# Patient Record
Sex: Male | Born: 1965
Health system: Southern US, Community
[De-identification: ages and names within clinical notes are randomized; demographics above are authoritative.]

## PROBLEM LIST (undated history)

## (undated) DIAGNOSIS — I10 Essential (primary) hypertension: Secondary | ICD-10-CM

## (undated) HISTORY — PX: WRIST SURGERY: SHX841

## (undated) HISTORY — PX: COLONOSCOPY W/ BIOPSIES: SHX1374

---

## 2010-09-17 ENCOUNTER — Emergency Department: Payer: Self-pay | Admitting: Emergency Medicine

## 2014-07-31 ENCOUNTER — Emergency Department: Payer: Self-pay | Admitting: Student

## 2015-01-26 ENCOUNTER — Encounter: Payer: Self-pay | Admitting: Primary Care

## 2015-01-26 ENCOUNTER — Ambulatory Visit (INDEPENDENT_AMBULATORY_CARE_PROVIDER_SITE_OTHER): Payer: BLUE CROSS/BLUE SHIELD | Admitting: Primary Care

## 2015-01-26 VITALS — BP 124/80 | HR 86 | Temp 98.5°F | Ht 71.0 in | Wt 246.8 lb

## 2015-01-26 DIAGNOSIS — G479 Sleep disorder, unspecified: Secondary | ICD-10-CM | POA: Diagnosis not present

## 2015-01-26 DIAGNOSIS — E669 Obesity, unspecified: Secondary | ICD-10-CM | POA: Diagnosis not present

## 2015-01-26 DIAGNOSIS — Z720 Tobacco use: Secondary | ICD-10-CM | POA: Diagnosis not present

## 2015-01-26 NOTE — Progress Notes (Signed)
Pre visit review using our clinic review tool, if applicable. No additional management support is needed unless otherwise documented below in the visit note. 

## 2015-01-26 NOTE — Assessment & Plan Note (Signed)
Suspect this may be caused by either the sweet tea he drinks at night or sleep apnea. Counseling provided to discontinue his sweet tea at night after 6pm. Referral made for sleep study given his body habitus and complaints mention in the HPI.

## 2015-01-26 NOTE — Progress Notes (Signed)
Subjective:    Patient ID: Austin Hill, male    DOB: [birth year], 49 y.o.   MRN: [id number]  HPI  Austin Hill is a 49 year old male who presents today to establish care and discuss the problems mentioned below. He's not been to a PCP in over 10 years; therefore, is not aware of any recent records.  1) Depression and anxiety: Diagnosed 4 years ago while going through a divorce. He participated in therapy and was placed on medication for about one year therapy and was on medication. 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 Denies panic attacks, excessive anxiety or worry over the past 6 months, difficulty concentrating, irritability   2) Sleep distrubance: He reports difficulty staying asleep throughout the night for the past several years.  On average he will get about 6 hours. He doesn't have difficulty initially falling asleep, but will find himself waking at night, experiencing daytime tiredness, snoring, and finding it difficult to fall back asleep. He will take benadryl OTC with occasional help. He does drink sweet tea before bed and will fall asleep watching TV.  He's not had a sleep study in the past.  3) Obesity: His diet is overall unhealthy as he does not watch his food intake. His diet consists of fast food, fried foods, eating out at restaurants. Lately he's tried improving his diet with grilled chicken, bread, grilled vegetables, and fruit. He mainly drinks sweet tea, coffee with milk and sugar, water, one mountain dew per day. He's active for work, but does not exercise daily. He plans on exercising at the neighborhood gym once he's moved into his new house.  Body mass index is 34.43 kg/(m^2).   4) Tobacco abuse: He's smoked cigarettes for the past 25 years, 1 PPD. He did quit once for 6 months about 15 years ago. He denies hemoptysis, shortness of  breath, coughs in the morning. He was once trialed on Chantix which made him feel "unsteady and awful".   Review of Systems  Constitutional: Negative for fatigue and unexpected weight change.  HENT: Negative for rhinorrhea.   Respiratory: Negative for cough and shortness of breath.   Cardiovascular: Negative for chest pain and leg swelling.  Gastrointestinal: Negative for diarrhea, constipation and blood in stool.  Genitourinary: Negative for dysuria and frequency.  Musculoskeletal: Negative for myalgias and arthralgias.  Skin: Negative for rash.  Neurological: Negative for dizziness and headaches.  Hematological: Negative for adenopathy.  Psychiatric/Behavioral:       Denies concerns for depression, occasional anxiety       No past medical history on file.  History   Social History  . Marital Status: Divorced    Spouse Name: N/A  . Number of Children: N/A  . Years of Education: N/A   Occupational History  . Not on file.   Social History Main Topics  . Smoking status: Current Every Day Smoker -- 1.00 packs/day for 25 years    Types: Cigarettes  . Smokeless tobacco: Never Used  . Alcohol Use: 0.0 oz/week    0 Standard drinks or equivalent per week     Comment: every couple of days  . Drug Use: No  . Sexual Activity: Not on file   Other Topics Concern  . Not on file   Social History Narrative   From [place] area.   Single, has girlfriend.  Three children. His son lives locally.   Enjoy camping, golfing, swimming.    Past Surgical History  Procedure Laterality Date  . Wrist surgery Left 103-66 years old    compound fracture  . Colonoscopy w/ biopsies  about 10 years ago    polyps    Family History  Problem Relation Age of Onset  . Aneurysm Father     Abdominal  . Aneurysm Paternal Grandfather     Abdominal     No Known Allergies  No current outpatient prescriptions on file prior to visit.   No current facility-administered medications on file prior  to visit.    BP 124/80 mmHg  Pulse 86  Temp(Src) 98.5 F (36.9 C) (Oral)  Ht 5\' 11"  (1.803 m)  Wt 246 lb 12 oz (111.925 kg)  BMI 34.43 kg/m2  SpO2 97%    Objective:   Physical Exam  Constitutional: He is oriented to person, place, and time. He appears well-developed.  HENT:  Right Ear: Tympanic membrane and ear canal normal.  Left Ear: Tympanic membrane and ear canal normal.  Nose: Nose normal.  Mouth/Throat: Oropharynx is clear and moist.  Eyes: Conjunctivae and EOM are normal. Pupils are equal, round, and reactive to light.  Neck: Neck supple. No thyromegaly present.  Cardiovascular: Normal rate and regular rhythm.   Pulmonary/Chest: Effort normal and breath sounds normal.  Abdominal: Soft. Bowel sounds are normal.  Lymphadenopathy:    He has no cervical adenopathy.  Neurological: He is alert and oriented to person, place, and time. He has normal reflexes. No cranial nerve deficit.  Skin: Skin is warm and dry.  Psychiatric: He has a normal mood and affect.          Assessment & Plan:

## 2015-01-26 NOTE — Assessment & Plan Note (Signed)
Overall unhealthy diet.  Spent time discussing healthy food options and the importance of exercising. Will complete physical in the next 1-2 months. Will continue to monitor.

## 2015-01-26 NOTE — Patient Instructions (Signed)
You will be contacted regarding your referral to pulmonology for your sleep study. You need to be exercising at least 30-45 minutes daily for 5 days a week. Limit carbohydrates in the form of pastas, rice, potatoes, grits, fried foods, sugary foods and drinks. Incorporate fresh fruits and vegetables, lean meat, whole grain, water. It's crucial to quit smoking. You may try the nicotine patch in combination with the gum as discussed.  Schedule a physical and lab appointment with me in the next 2 months.  It was a pleasure to meet you today! Please don't hesitate to call me with any questions. Welcome to [facility]!  Cardiac Diet This diet can help prevent heart disease and stroke. Many factors influence your heart health, including eating and exercise habits. Coronary risk rises a lot with abnormal blood fat (lipid) levels. Cardiac meal planning includes limiting unhealthy fats, increasing healthy fats, and making other small dietary changes. General guidelines are as follows:  Adjust calorie intake to reach and maintain desirable body weight.  Limit total fat intake to less than 30% of total calories. Saturated fat should be less than 7% of calories.  Saturated fats are found in animal products and in some vegetable products. Saturated vegetable fats are found in coconut oil, cocoa butter, palm oil, and palm kernel oil. Read labels carefully to avoid these products as much as possible. Use butter in moderation. Choose tub margarines and oils that have 2 grams of fat or less. Good cooking oils are canola and olive oils.  Practice low-fat cooking techniques. Do not fry food. Instead, broil, bake, boil, steam, grill, roast on a rack, stir-fry, or microwave it. Other fat reducing suggestions include:  Remove the skin from poultry.  Remove all visible fat from meats.  Skim the fat off stews, soups, and gravies before serving them.  Steam vegetables in water or broth instead of sauting them in  fat.  Avoid foods with trans fat (or hydrogenated oils), such as commercially fried foods and commercially baked goods. Commercial shortening and deep-frying fats will contain trans fat.  Increase intake of fruits, vegetables, whole grains, and legumes to replace foods high in fat.  Increase consumption of nuts, legumes, and seeds to at least 4 servings weekly. One serving of a legume equals  cup, and 1 serving of nuts or seeds equals  cup.  Choose whole grains more often. Have 3 servings per day (a serving is 1 ounce [oz]).  Eat 4 to 5 servings of vegetables per day. A serving of vegetables is 1 cup of raw leafy vegetables;  cup of raw or cooked cut-up vegetables;  cup of vegetable juice.  Eat 4 to 5 servings of fruit per day. A serving of fruit is 1 medium whole fruit;  cup of dried fruit;  cup of fresh, frozen, or canned fruit;  cup of 100% fruit juice.  Increase your intake of dietary fiber to 20 to 30 grams per day. Insoluble fiber may help lower your risk of heart disease and may help curb your appetite.  Soluble fiber binds cholesterol to be removed from the blood. Foods high in soluble fiber are dried beans, citrus fruits, oats, apples, bananas, broccoli, Brussels sprouts, and eggplant.  Try to include foods fortified with plant sterols or stanols, such as yogurt, breads, juices, or margarines. Choose several fortified foods to achieve a daily intake of 2 to 3 grams of plant sterols or stanols.  Foods with omega-3 fats can help reduce your risk of heart disease. Aim  to have a 3.5 oz portion of fatty fish twice per week, such as salmon, mackerel, albacore tuna, sardines, lake trout, or herring. If you wish to take a fish oil supplement, choose one that contains 1 gram of both DHA and EPA.  Limit processed meats to 2 servings (3 oz portion) weekly.  Limit the sodium in your diet to 1500 milligrams (mg) per day. If you have high blood pressure, talk to a registered dietitian about  a DASH (Dietary Approaches to Stop Hypertension) eating plan.  Limit sweets and beverages with added sugar, such as soda, to no more than 5 servings per week. One serving is:   1 tablespoon sugar.  1 tablespoon jelly or jam.   cup sorbet.  1 cup lemonade.   cup regular soda. CHOOSING FOODS Starches  Allowed: Breads: All kinds (wheat, rye, raisin, white, oatmeal, [place], [place], and English muffin bread). Low-fat rolls: English muffins, frankfurter and hamburger buns, bagels, pita bread, tortillas (not fried). Pancakes, waffles, biscuits, and muffins made with recommended oil.  Avoid: Products made with saturated or trans fats, oils, or whole milk products. Butter rolls, cheese breads, croissants. Commercial doughnuts, muffins, sweet rolls, biscuits, waffles, pancakes, store-bought mixes. Crackers  Allowed: Low-fat crackers and snacks: Animal, graham, rye, saltine (with recommended oil, no lard), oyster, and matzo crackers. Bread sticks, melba toast, rusks, flatbread, pretzels, and light popcorn.  Avoid: High-fat crackers: cheese crackers, butter crackers, and those made with coconut, palm oil, or trans fat (hydrogenated oils). Buttered popcorn. Cereals  Allowed: Hot or cold whole-grain cereals.  Avoid: Cereals containing coconut, hydrogenated vegetable fat, or animal fat. Potatoes / Pasta / Rice  Allowed: All kinds of potatoes, rice, and pasta (such as macaroni, spaghetti, and noodles).  Avoid: Pasta or rice prepared with cream sauce or high-fat cheese. Chow mein noodles, [place] fries. Vegetables  Allowed: All vegetables and vegetable juices.  Avoid: Fried vegetables. Vegetables in cream, butter, or high-fat cheese sauces. Limit coconut. Fruit in cream or custard. Protein  Allowed: Limit your intake of meat, seafood, and poultry to no more than 6 oz (cooked weight) per day. All lean, well-trimmed beef, veal, pork, and lamb. All chicken and [place] without skin. All fish  and shellfish. Wild game: wild duck, rabbit, pheasant, and venison. Egg whites or low-cholesterol egg substitutes may be used as desired. Meatless dishes: recipes with dried beans, peas, lentils, and tofu (soybean curd). Seeds and nuts: all seeds and most nuts.  Avoid: Prime grade and other heavily marbled and fatty meats, such as short ribs, spare ribs, rib eye roast or steak, frankfurters, sausage, bacon, and high-fat luncheon meats, mutton. Caviar. Commercially fried fish. Domestic duck, goose, venison sausage. Organ meats: liver, gizzard, heart, chitterlings, brains, kidney, sweetbreads. Dairy  Allowed: Low-fat cheeses: nonfat or low-fat cottage cheese (1% or 2% fat), cheeses made with part skim milk, such as mozzarella, farmers, string, or ricotta. (Cheeses should be labeled no more than 2 to 6 grams fat per oz.). Skim (or 1%) milk: liquid, powdered, or evaporated. Buttermilk made with low-fat milk. Drinks made with skim or low-fat milk or cocoa. Chocolate milk or cocoa made with skim or low-fat (1%) milk. Nonfat or low-fat yogurt.  Avoid: Whole milk cheeses, including colby, cheddar, [place], Monterey Jack, [employer], [place], [place], American, Swiss, and blue. Creamed cottage cheese, cream cheese. Whole milk and whole milk products, including buttermilk or yogurt made from whole milk, drinks made from whole milk. Condensed milk, evaporated whole milk, and 2% milk. Soups and Combination Foods  Allowed: Low-fat low-sodium soups: broth, dehydrated soups, homemade broth, soups with the fat removed, homemade cream soups made with skim or low-fat milk. Low-fat spaghetti, lasagna, chili, and Spanish rice if low-fat ingredients and low-fat cooking techniques are used.  Avoid: Cream soups made with whole milk, cream, or high-fat cheese. All other soups. Desserts and Sweets  Allowed: Sherbet, fruit ices, gelatins, meringues, and angel food cake. Homemade desserts with recommended fats, oils, and milk  products. Jam, jelly, honey, marmalade, sugars, and syrups. Pure sugar candy, such as gum drops, hard candy, jelly beans, marshmallows, mints, and small amounts of dark chocolate.  Avoid: Commercially prepared cakes, pies, cookies, frosting, pudding, or mixes for these products. Desserts containing whole milk products, chocolate, coconut, lard, palm oil, or palm kernel oil. Ice cream or ice cream drinks. Candy that contains chocolate, coconut, butter, hydrogenated fat, or unknown ingredients. Buttered syrups. Fats and Oils  Allowed: Vegetable oils: safflower, sunflower, corn, soybean, cottonseed, sesame, canola, olive, or peanut. Non-hydrogenated margarines. Salad dressing or mayonnaise: homemade or commercial, made with a recommended oil. Low or nonfat salad dressing or mayonnaise.  Limit added fats and oils to 6 to 8 tsp per day (includes fats used in cooking, baking, salads, and spreads on bread). Remember to count the "hidden fats" in foods.  Avoid: Solid fats and shortenings: butter, lard, salt pork, bacon drippings. Gravy containing meat fat, shortening, or suet. Cocoa butter, coconut. Coconut oil, palm oil, palm kernel oil, or hydrogenated oils: these ingredients are often used in bakery products, nondairy creamers, whipped toppings, candy, and commercially fried foods. Read labels carefully. Salad dressings made of unknown oils, sour cream, or cheese, such as blue cheese and Roquefort. Cream, all kinds: half-and-half, light, heavy, or whipping. Sour cream or cream cheese (even if "light" or low-fat). Nondairy cream substitutes: coffee creamers and sour cream substitutes made with palm, palm kernel, hydrogenated oils, or coconut oil. Beverages  Allowed: Coffee (regular or decaffeinated), tea. Diet carbonated beverages, mineral water. Alcohol: Check with your caregiver. Moderation is recommended.  Avoid: Whole milk, regular sodas, and juice drinks with added sugar. Condiments  Allowed: All  seasonings and condiments. Cocoa powder. "Cream" sauces made with recommended ingredients.  Avoid: Carob powder made with hydrogenated fats. SAMPLE MENU Breakfast   cup orange juice   cup oatmeal  1 slice toast  1 tsp margarine  1 cup skim milk Lunch  [place] sandwich with 2 oz [place], 2 slices bread  Lettuce and tomato slices  Fresh fruit  Carrot sticks  Coffee or tea Snack  Fresh fruit or low-fat crackers Dinner  3 oz lean ground beef  1 baked potato  1 tsp margarine   cup asparagus  Lettuce salad  1 tbs non-creamy dressing   cup peach slices  1 cup skim milk Document Released: 06/28/2008 Document Revised: 03/20/2012 Document Reviewed: 11/19/2013 Exit[employer] Patient Information 2015 [place], [place]. This information is not intended to replace advice given to you by your health care provider. Make sure you discuss any questions you have with your health care provider.

## 2015-01-26 NOTE — Assessment & Plan Note (Signed)
Spent >5 minutes discussing the importance of quitting. He plans on weaning himself down off of cigarettes. Information provided on the nicotine patch and gum. He's traialed Chantix in the past which made him feel awful. Will continue to monitor

## 2015-02-01 ENCOUNTER — Other Ambulatory Visit: Payer: Self-pay | Admitting: Primary Care

## 2015-02-01 DIAGNOSIS — Z0000 Encounter for general adult medical examination without abnormal findings: Secondary | ICD-10-CM

## 2015-02-03 ENCOUNTER — Other Ambulatory Visit (INDEPENDENT_AMBULATORY_CARE_PROVIDER_SITE_OTHER): Payer: BLUE CROSS/BLUE SHIELD

## 2015-02-03 DIAGNOSIS — Z125 Encounter for screening for malignant neoplasm of prostate: Secondary | ICD-10-CM | POA: Diagnosis not present

## 2015-02-03 DIAGNOSIS — Z0000 Encounter for general adult medical examination without abnormal findings: Secondary | ICD-10-CM | POA: Diagnosis not present

## 2015-02-03 LAB — CBC WITH DIFFERENTIAL/PLATELET
BASOS PCT: 0.6 % (ref 0.0–3.0)
Basophils Absolute: 0 10*3/uL (ref 0.0–0.1)
EOS ABS: 0.1 10*3/uL (ref 0.0–0.7)
Eosinophils Relative: 1.9 % (ref 0.0–5.0)
HCT: 46.7 % (ref 39.0–52.0)
HEMOGLOBIN: 16.3 g/dL (ref 13.0–17.0)
LYMPHS ABS: 1.4 10*3/uL (ref 0.7–4.0)
Lymphocytes Relative: 23.3 % (ref 12.0–46.0)
MCHC: 34.8 g/dL (ref 30.0–36.0)
MCV: 94.1 fl (ref 78.0–100.0)
MONO ABS: 0.4 10*3/uL (ref 0.1–1.0)
Monocytes Relative: 7.2 % (ref 3.0–12.0)
NEUTROS ABS: 4.1 10*3/uL (ref 1.4–7.7)
Neutrophils Relative %: 67 % (ref 43.0–77.0)
Platelets: 233 10*3/uL (ref 150.0–400.0)
RBC: 4.97 Mil/uL (ref 4.22–5.81)
RDW: 13.4 % (ref 11.5–15.5)
WBC: 6.2 10*3/uL (ref 4.0–10.5)

## 2015-02-03 LAB — COMPREHENSIVE METABOLIC PANEL
ALBUMIN: 4.2 g/dL (ref 3.5–5.2)
ALT: 19 U/L (ref 0–53)
AST: 15 U/L (ref 0–37)
Alkaline Phosphatase: 51 U/L (ref 39–117)
BUN: 11 mg/dL (ref 6–23)
CALCIUM: 9.7 mg/dL (ref 8.4–10.5)
CHLORIDE: 104 meq/L (ref 96–112)
CO2: 25 mEq/L (ref 19–32)
CREATININE: 1.04 mg/dL (ref 0.40–1.50)
GFR: 80.28 mL/min (ref >60.00)
Glucose, Bld: 101 mg/dL — ABNORMAL HIGH (ref 70–99)
POTASSIUM: 4.2 meq/L (ref 3.5–5.1)
SODIUM: 136 meq/L (ref 135–145)
TOTAL PROTEIN: 6.8 g/dL (ref 6.0–8.3)
Total Bilirubin: 0.6 mg/dL (ref 0.2–1.2)

## 2015-02-03 LAB — LIPID PANEL
CHOL/HDL RATIO: 4
Cholesterol: 190 mg/dL (ref 0–200)
HDL: 48.2 mg/dL (ref >39.00)
LDL Cholesterol: 121 mg/dL — ABNORMAL HIGH (ref 0–99)
NONHDL: 141.8
Triglycerides: 103 mg/dL (ref 0.0–149.0)
VLDL: 20.6 mg/dL (ref 0.0–40.0)

## 2015-02-03 LAB — PSA: PSA: 0.77 ng/mL (ref 0.10–4.00)

## 2015-02-03 LAB — TSH: TSH: 1.74 u[IU]/mL (ref 0.35–4.50)

## 2015-02-03 LAB — HEMOGLOBIN A1C: Hgb A1c MFr Bld: 6 % (ref 4.6–6.5)

## 2015-02-04 ENCOUNTER — Other Ambulatory Visit: Payer: BLUE CROSS/BLUE SHIELD

## 2015-02-06 ENCOUNTER — Other Ambulatory Visit: Payer: BLUE CROSS/BLUE SHIELD

## 2015-02-10 ENCOUNTER — Ambulatory Visit (INDEPENDENT_AMBULATORY_CARE_PROVIDER_SITE_OTHER): Payer: BLUE CROSS/BLUE SHIELD | Admitting: Primary Care

## 2015-02-10 ENCOUNTER — Encounter: Payer: Self-pay | Admitting: Primary Care

## 2015-02-10 VITALS — BP 126/84 | HR 88 | Temp 98.8°F | Ht 71.0 in | Wt 248.0 lb

## 2015-02-10 DIAGNOSIS — Z0000 Encounter for general adult medical examination without abnormal findings: Secondary | ICD-10-CM

## 2015-02-10 DIAGNOSIS — Z0001 Encounter for general adult medical examination with abnormal findings: Secondary | ICD-10-CM | POA: Insufficient documentation

## 2015-02-10 DIAGNOSIS — Z23 Encounter for immunization: Secondary | ICD-10-CM | POA: Diagnosis not present

## 2015-02-10 DIAGNOSIS — Z1211 Encounter for screening for malignant neoplasm of colon: Secondary | ICD-10-CM

## 2015-02-10 DIAGNOSIS — Z720 Tobacco use: Secondary | ICD-10-CM

## 2015-02-10 DIAGNOSIS — Z136 Encounter for screening for cardiovascular disorders: Secondary | ICD-10-CM

## 2015-02-10 DIAGNOSIS — E669 Obesity, unspecified: Secondary | ICD-10-CM

## 2015-02-10 DIAGNOSIS — G479 Sleep disorder, unspecified: Secondary | ICD-10-CM

## 2015-02-10 NOTE — Progress Notes (Signed)
Subjective:    Patient ID: Austin Hill, male    DOB: 02-27-1966, 49 y.o.   MRN: [id number]  HPI  Austin Hill is a 49 year old male who presents today for complete physical.  Immunizations: -Tetanus: Last Tdap in 1977, last Td was in 1999. Due for Tdap today. -Influenza: Did not get last season -Pneumonia: Current smoker, has never had, declines today.  Diet: He's been trying to incorporate more fruit; grilled lean meats. Drinking more water, has been limiting his sweet tea, and has now decreased to 1 soda per day.  Exercise: He's active at work and on the weekend; plans on joining the gym soon after moving. Eye exam: Last exam was one year ago. Follows [facility] ([facility] and plans on scheduling. Dental exam: Last exam was one year ago. Follows with [facility], plans on scheduling. Colonoscopy: Due Sleep Study: Referral made last visit; he postponed the appointment for now.  1) FH of abdominal aneurysm: Last ultrasound was 10 years ago. 2) Tobacco abuse: He's trying to cut back and is smoking 1 PPD. He's looking into the patch and gum, but is not ready to quit.  3) Stress: reports increased stress at work with some anxiety. Denies symptoms of worry, irritability, restlessness, difficulty sleeping. He's able to manage his stress by removing himself from the situation. He would like further evaluation of his stress during the next visit.  Review of Systems  Constitutional: Negative for unexpected weight change.       Daytime tiredness  HENT: Negative for rhinorrhea.   Eyes: Positive for photophobia.  Respiratory: Negative for cough and shortness of breath.   Cardiovascular: Negative for chest pain.  Gastrointestinal: Negative for diarrhea, constipation and blood in stool.  Genitourinary: Negative for dysuria, frequency and difficulty urinating.  Musculoskeletal: Negative for myalgias and arthralgias.  Skin: Negative for rash.  Allergic/Immunologic: Negative for  environmental allergies.  Neurological: Negative for dizziness, numbness and headaches.  Hematological: Negative for adenopathy.       No past medical history on file.  History   Social History  . Marital Status: Divorced    Spouse Name: N/A  . Number of Children: N/A  . Years of Education: N/A   Occupational History  . Not on file.   Social History Main Topics  . Smoking status: Current Every Day Smoker -- 1.00 packs/day for 25 years    Types: Cigarettes  . Smokeless tobacco: Never Used  . Alcohol Use: 0.0 oz/week    0 Standard drinks or equivalent per week     Comment: every couple of days  . Drug Use: No  . Sexual Activity: Not on file   Other Topics Concern  . Not on file   Social History Narrative   From [place] area.   Single, has girlfriend.   Three children. His son lives locally.   Enjoy camping, golfing, swimming.    Past Surgical History  Procedure Laterality Date  . Wrist surgery Left 32-26 years old    compound fracture  . Colonoscopy w/ biopsies  about 10 years ago    polyps    Family History  Problem Relation Age of Onset  . Aneurysm Father     Abdominal  . Aneurysm Paternal Grandfather     Abdominal     No Known Allergies  Current Outpatient Prescriptions on File Prior to Visit  Medication Sig Dispense Refill  . ibuprofen (ADVIL,MOTRIN) 200 MG tablet Take 200 mg by mouth as needed.    Marland Kitchen  Multiple Vitamin (MULTIVITAMIN) tablet Take 1 tablet by mouth daily.     No current facility-administered medications on file prior to visit.    BP 126/84 mmHg  Pulse 88  Temp(Src) 98.8 F (37.1 C) (Oral)  Ht 5\' 11"  (1.803 m)  Wt 248 lb (112.492 kg)  BMI 34.60 kg/m2  SpO2 96%    Objective:   Physical Exam  Constitutional: He is oriented to person, place, and time. He appears well-developed.  HENT:  Right Ear: Tympanic membrane and ear canal normal.  Left Ear: Tympanic membrane and ear canal normal.  Nose: Nose normal.  Mouth/Throat:  Oropharynx is clear and moist.  Eyes: Conjunctivae and EOM are normal. Pupils are equal, round, and reactive to light.  Neck: Neck supple. No thyromegaly present.  Cardiovascular: Normal rate and regular rhythm.   Pulmonary/Chest: Effort normal and breath sounds normal.  Abdominal: Soft. Bowel sounds are normal.  Musculoskeletal: Normal range of motion.  Lymphadenopathy:    He has no cervical adenopathy.  Neurological: He is alert and oriented to person, place, and time. He has normal reflexes. No cranial nerve deficit. Coordination normal.  Skin: Skin is warm and dry.  Psychiatric: He has a normal mood and affect.          Assessment & Plan:  Stress:  Will discuss at future appointment. Will consider SSRI and/or therapy if stress is still problematic. He plans on trying to work through daily stress on his own at this time.

## 2015-02-10 NOTE — Progress Notes (Signed)
Pre visit review using our clinic review tool, if applicable. No additional management support is needed unless otherwise documented below in the visit note. 

## 2015-02-10 NOTE — Assessment & Plan Note (Signed)
Discussed importance of healthy diet and exercise. Tdap administered today as he was due. Declines Pneumovax. Referral made for colonoscopy and abdominal ultrasound due to [place] of aortic aneurysm.  Due for eye and dental exams for which he will schedule soon.

## 2015-02-10 NOTE — Assessment & Plan Note (Signed)
Working towards improving his diet with healthier food choices. He will be moving soon and plans on exercising in his new complex.

## 2015-02-10 NOTE — Assessment & Plan Note (Signed)
Smoking 1 PPD, is not ready to quit. Is considering trying the patch and gum.

## 2015-02-10 NOTE — Assessment & Plan Note (Signed)
Referral made for sleep study, he cancelled his appointment due to "other issues" he would like to address first. Explained the long term effects of sleep apnea and the importance of a sleep study. He will re-schedule at a later date.

## 2015-02-10 NOTE — Patient Instructions (Addendum)
You will be contacted regarding your Colonoscopy and Abdominal Ultrasound. Let me know if you do not hear back within one week. Your labs are overall good.  It is important that you improve your diet. Please limit carbohydrates in the form of white bread, rice, pasta, cakes, cookies, sugary drinks, etc. Increase your consumption of fresh fruits and vegetables. Be sure to drink plenty of water daily. Call me when you are ready to stop smoking. Follow up in 1 month for re-evaluation of stress/anxiety. It was nice seeing you today!  Cardiac Diet This diet can help prevent heart disease and stroke. Many factors influence your heart health, including eating and exercise habits. Coronary risk rises a lot with abnormal blood fat (lipid) levels. Cardiac meal planning includes limiting unhealthy fats, increasing healthy fats, and making other small dietary changes. General guidelines are as follows:  Adjust calorie intake to reach and maintain desirable body weight.  Limit total fat intake to less than 30% of total calories. Saturated fat should be less than 7% of calories.  Saturated fats are found in animal products and in some vegetable products. Saturated vegetable fats are found in coconut oil, cocoa butter, palm oil, and palm kernel oil. Read labels carefully to avoid these products as much as possible. Use butter in moderation. Choose tub margarines and oils that have 2 grams of fat or less. Good cooking oils are canola and olive oils.  Practice low-fat cooking techniques. Do not fry food. Instead, broil, bake, boil, steam, grill, roast on a rack, stir-fry, or microwave it. Other fat reducing suggestions include:  Remove the skin from poultry.  Remove all visible fat from meats.  Skim the fat off stews, soups, and gravies before serving them.  Steam vegetables in water or broth instead of sauting them in fat.  Avoid foods with trans fat (or hydrogenated oils), such as commercially fried  foods and commercially baked goods. Commercial shortening and deep-frying fats will contain trans fat.  Increase intake of fruits, vegetables, whole grains, and legumes to replace foods high in fat.  Increase consumption of nuts, legumes, and seeds to at least 4 servings weekly. One serving of a legume equals  cup, and 1 serving of nuts or seeds equals  cup.  Choose whole grains more often. Have 3 servings per day (a serving is 1 ounce [oz]).  Eat 4 to 5 servings of vegetables per day. A serving of vegetables is 1 cup of raw leafy vegetables;  cup of raw or cooked cut-up vegetables;  cup of vegetable juice.  Eat 4 to 5 servings of fruit per day. A serving of fruit is 1 medium whole fruit;  cup of dried fruit;  cup of fresh, frozen, or canned fruit;  cup of 100% fruit juice.  Increase your intake of dietary fiber to 20 to 30 grams per day. Insoluble fiber may help lower your risk of heart disease and may help curb your appetite.  Soluble fiber binds cholesterol to be removed from the blood. Foods high in soluble fiber are dried beans, citrus fruits, oats, apples, bananas, broccoli, Brussels sprouts, and eggplant.  Try to include foods fortified with plant sterols or stanols, such as yogurt, breads, juices, or margarines. Choose several fortified foods to achieve a daily intake of 2 to 3 grams of plant sterols or stanols.  Foods with omega-3 fats can help reduce your risk of heart disease. Aim to have a 3.5 oz portion of fatty fish twice per week, such as salmon,  mackerel, albacore tuna, sardines, lake trout, or herring. If you wish to take a fish oil supplement, choose one that contains 1 gram of both DHA and EPA.  Limit processed meats to 2 servings (3 oz portion) weekly.  Limit the sodium in your diet to 1500 milligrams (mg) per day. If you have high blood pressure, talk to a registered dietitian about a DASH (Dietary Approaches to Stop Hypertension) eating plan.  Limit sweets and  beverages with added sugar, such as soda, to no more than 5 servings per week. One serving is:   1 tablespoon sugar.  1 tablespoon jelly or jam.   cup sorbet.  1 cup lemonade.   cup regular soda. CHOOSING FOODS Starches  Allowed: Breads: All kinds (wheat, rye, raisin, white, oatmeal, [place], [place], and English muffin bread). Low-fat rolls: English muffins, frankfurter and hamburger buns, bagels, pita bread, tortillas (not fried). Pancakes, waffles, biscuits, and muffins made with recommended oil.  Avoid: Products made with saturated or trans fats, oils, or whole milk products. Butter rolls, cheese breads, croissants. Commercial doughnuts, muffins, sweet rolls, biscuits, waffles, pancakes, store-bought mixes. Crackers  Allowed: Low-fat crackers and snacks: Animal, graham, rye, saltine (with recommended oil, no lard), oyster, and matzo crackers. Bread sticks, melba toast, rusks, flatbread, pretzels, and light popcorn.  Avoid: High-fat crackers: cheese crackers, butter crackers, and those made with coconut, palm oil, or trans fat (hydrogenated oils). Buttered popcorn. Cereals  Allowed: Hot or cold whole-grain cereals.  Avoid: Cereals containing coconut, hydrogenated vegetable fat, or animal fat. Potatoes / Pasta / Rice  Allowed: All kinds of potatoes, rice, and pasta (such as macaroni, spaghetti, and noodles).  Avoid: Pasta or rice prepared with cream sauce or high-fat cheese. Chow mein noodles, [place] fries. Vegetables  Allowed: All vegetables and vegetable juices.  Avoid: Fried vegetables. Vegetables in cream, butter, or high-fat cheese sauces. Limit coconut. Fruit in cream or custard. Protein  Allowed: Limit your intake of meat, seafood, and poultry to no more than 6 oz (cooked weight) per day. All lean, well-trimmed beef, veal, pork, and lamb. All chicken and [place] without skin. All fish and shellfish. Wild game: wild duck, rabbit, pheasant, and venison. Egg whites or  low-cholesterol egg substitutes may be used as desired. Meatless dishes: recipes with dried beans, peas, lentils, and tofu (soybean curd). Seeds and nuts: all seeds and most nuts.  Avoid: Prime grade and other heavily marbled and fatty meats, such as short ribs, spare ribs, rib eye roast or steak, frankfurters, sausage, bacon, and high-fat luncheon meats, mutton. Caviar. Commercially fried fish. Domestic duck, goose, venison sausage. Organ meats: liver, gizzard, heart, chitterlings, brains, kidney, sweetbreads. Dairy  Allowed: Low-fat cheeses: nonfat or low-fat cottage cheese (1% or 2% fat), cheeses made with part skim milk, such as mozzarella, farmers, string, or ricotta. (Cheeses should be labeled no more than 2 to 6 grams fat per oz.). Skim (or 1%) milk: liquid, powdered, or evaporated. Buttermilk made with low-fat milk. Drinks made with skim or low-fat milk or cocoa. Chocolate milk or cocoa made with skim or low-fat (1%) milk. Nonfat or low-fat yogurt.  Avoid: Whole milk cheeses, including colby, cheddar, [place], Monterey Jack, [place], [place], [place], American, Swiss, and blue. Creamed cottage cheese, cream cheese. Whole milk and whole milk products, including buttermilk or yogurt made from whole milk, drinks made from whole milk. Condensed milk, evaporated whole milk, and 2% milk. Soups and Combination Foods  Allowed: Low-fat low-sodium soups: broth, dehydrated soups, homemade broth, soups with the fat removed,  homemade cream soups made with skim or low-fat milk. Low-fat spaghetti, lasagna, chili, and Spanish rice if low-fat ingredients and low-fat cooking techniques are used.  Avoid: Cream soups made with whole milk, cream, or high-fat cheese. All other soups. Desserts and Sweets  Allowed: Sherbet, fruit ices, gelatins, meringues, and angel food cake. Homemade desserts with recommended fats, oils, and milk products. Jam, jelly, honey, marmalade, sugars, and syrups. Pure sugar candy, such as  gum drops, hard candy, jelly beans, marshmallows, mints, and small amounts of dark chocolate.  Avoid: Commercially prepared cakes, pies, cookies, frosting, pudding, or mixes for these products. Desserts containing whole milk products, chocolate, coconut, lard, palm oil, or palm kernel oil. Ice cream or ice cream drinks. Candy that contains chocolate, coconut, butter, hydrogenated fat, or unknown ingredients. Buttered syrups. Fats and Oils  Allowed: Vegetable oils: safflower, sunflower, corn, soybean, cottonseed, sesame, canola, olive, or peanut. Non-hydrogenated margarines. Salad dressing or mayonnaise: homemade or commercial, made with a recommended oil. Low or nonfat salad dressing or mayonnaise.  Limit added fats and oils to 6 to 8 tsp per day (includes fats used in cooking, baking, salads, and spreads on bread). Remember to count the "hidden fats" in foods.  Avoid: Solid fats and shortenings: butter, lard, salt pork, bacon drippings. Gravy containing meat fat, shortening, or suet. Cocoa butter, coconut. Coconut oil, palm oil, palm kernel oil, or hydrogenated oils: these ingredients are often used in bakery products, nondairy creamers, whipped toppings, candy, and commercially fried foods. Read labels carefully. Salad dressings made of unknown oils, sour cream, or cheese, such as blue cheese and Roquefort. Cream, all kinds: half-and-half, light, heavy, or whipping. Sour cream or cream cheese (even if "light" or low-fat). Nondairy cream substitutes: coffee creamers and sour cream substitutes made with palm, palm kernel, hydrogenated oils, or coconut oil. Beverages  Allowed: Coffee (regular or decaffeinated), tea. Diet carbonated beverages, mineral water. Alcohol: Check with your caregiver. Moderation is recommended.  Avoid: Whole milk, regular sodas, and juice drinks with added sugar. Condiments  Allowed: All seasonings and condiments. Cocoa powder. "Cream" sauces made with recommended  ingredients.  Avoid: Carob powder made with hydrogenated fats. SAMPLE MENU Breakfast   cup orange juice   cup oatmeal  1 slice toast  1 tsp margarine  1 cup skim milk Lunch  [place] sandwich with 2 oz [place], 2 slices bread  Lettuce and tomato slices  Fresh fruit  Carrot sticks  Coffee or tea Snack  Fresh fruit or low-fat crackers Dinner  3 oz lean ground beef  1 baked potato  1 tsp margarine   cup asparagus  Lettuce salad  1 tbs non-creamy dressing   cup peach slices  1 cup skim milk Document Released: 06/28/2008 Document Revised: 03/20/2012 Document Reviewed: 11/19/2013 ExitCare Patient Information 2015 [place], [place]. This information is not intended to replace advice given to you by your health care provider. Make sure you discuss any questions you have with your health care provider.

## 2015-03-16 ENCOUNTER — Telehealth: Payer: Self-pay | Admitting: Primary Care

## 2015-03-16 ENCOUNTER — Ambulatory Visit: Payer: BLUE CROSS/BLUE SHIELD | Admitting: Primary Care

## 2015-03-16 NOTE — Telephone Encounter (Signed)
Appointment has been canceled.

## 2015-03-16 NOTE — Telephone Encounter (Signed)
Called patient regarding missed appointment. He reports his anxiety has decreased and is managing well. He is to call if his symptoms worsen. Vallarie Mare, please do not charge him a "no show" fee. Thanks.

## 2015-03-30 ENCOUNTER — Telehealth: Payer: Self-pay | Admitting: Primary Care

## 2015-03-30 NOTE — Telephone Encounter (Signed)
Noted  

## 2015-03-30 NOTE — Telephone Encounter (Signed)
Spoke to pt. He does not want the AAA procedure at this time. Please cancel order.   Also, pt did not want pulmonary and gi referral either. I have cancelled those referrals as well.  Thanks

## 2015-09-24 ENCOUNTER — Ambulatory Visit: Payer: BLUE CROSS/BLUE SHIELD | Admitting: Primary Care

## 2016-01-12 ENCOUNTER — Telehealth: Payer: Self-pay | Admitting: Primary Care

## 2016-01-12 NOTE — Telephone Encounter (Signed)
I spoke with pt; pt has not felt himself for couple days; pt said has a stressful job; pt continues with the H/A and on and off SOB; no CP or dizziness. Pt is on no meds and has not had hx of hypertension; offered pt appt at another [facility] but was too far for pt to go. Pt will go to [facility] now.

## 2016-01-12 NOTE — Telephone Encounter (Signed)
Patient Name: CHRIS Krahenbuhl  DOB: 18-Sep-1966    Initial Comment Caller states his BP is high 167/108.    Nurse Assessment  Nurse: Thad Ranger RN, Denise Date/Time (Eastern Time): 01/12/2016 1:52:49 PM  Confirm and document reason for call. If symptomatic, describe symptoms. You must click the next button to save text entered. ---Caller states his BP is high 167/108 taken at [employer] while working. Has had a HA and not felt "normal" over the last 2 days. No hx of HTN.  Has the patient traveled out of the country within the last 30 days? ---Not Applicable  Does the patient have any new or worsening symptoms? ---Yes  Will a triage be completed? ---Yes  Related visit to physician within the last 2 weeks? ---No  Does the PT have any chronic conditions? (i.e. diabetes, asthma, etc.) ---No  Is this a behavioral health or substance abuse call? ---No     Guidelines    Guideline Title Affirmed Question Affirmed Notes  High Blood Pressure [1] BP ? 160 / 100 AND [2] cardiac or neurologic symptoms (e.g., chest pain, difficulty breathing, unsteady gait, blurred vision)    Final Disposition User   Go to ED Now [place], RN, Langley Gauss    Comments  Pt reports slight SOB over the last 2 days as well. Wants RN to call to see if he can be seen at [facility] today. Does not want to go to ER.  Viewed schedule for multiple MD's in the practice and do not see any available appts today. Advised pt no available appts and will need to proceed to ER as advised. He states he may go to [facility] or just call the [facility] in the am to make an appt.  RN did not call [facility] for auth to downgrade to be seen in [facility] instead of ER due to there are no appts avail.   Referrals  GO TO FACILITY REFUSED  GO TO FACILITY REFUSED   Disagree/Comply: Disagree  Disagree/Comply Reason: Disagree with instructions

## 2016-01-12 NOTE — Telephone Encounter (Signed)
Noted. Would like to see for follow up in 1-2 weeks. Please schedule.

## 2016-01-13 NOTE — Telephone Encounter (Signed)
Called patient and schedule follow up on 01/19/2016

## 2016-01-19 ENCOUNTER — Ambulatory Visit (INDEPENDENT_AMBULATORY_CARE_PROVIDER_SITE_OTHER): Payer: BLUE CROSS/BLUE SHIELD | Admitting: Primary Care

## 2016-01-19 ENCOUNTER — Encounter: Payer: Self-pay | Admitting: Primary Care

## 2016-01-19 VITALS — BP 144/92 | HR 81 | Temp 98.5°F | Ht 71.0 in | Wt 249.4 lb

## 2016-01-19 DIAGNOSIS — IMO0001 Reserved for inherently not codable concepts without codable children: Secondary | ICD-10-CM

## 2016-01-19 DIAGNOSIS — R030 Elevated blood-pressure reading, without diagnosis of hypertension: Secondary | ICD-10-CM | POA: Diagnosis not present

## 2016-01-19 DIAGNOSIS — I10 Essential (primary) hypertension: Secondary | ICD-10-CM | POA: Insufficient documentation

## 2016-01-19 NOTE — Patient Instructions (Signed)
Check your blood pressure daily, around the same time of day, for the next 2 weeks.   Ensure that you have rested for 30 minutes prior to checking your blood pressure. Record your readings as I will call you in 2 weeks.  Take a look at the information below regarding dietary approaches to reduce blood pressure.  It was a pleasure to see you today!  DASH Eating Plan DASH stands for "Dietary Approaches to Stop Hypertension." The DASH eating plan is a healthy eating plan that has been shown to reduce high blood pressure (hypertension). Additional health benefits may include reducing the risk of type 2 diabetes mellitus, heart disease, and stroke. The DASH eating plan may also help with weight loss. WHAT DO I NEED TO KNOW ABOUT THE DASH EATING PLAN? For the DASH eating plan, you will follow these general guidelines:  Choose foods with a percent daily value for sodium of less than 5% (as listed on the food label).  Use salt-free seasonings or herbs instead of table salt or sea salt.  Check with your health care provider or pharmacist before using salt substitutes.  Eat lower-sodium products, often labeled as "lower sodium" or "no salt added."  Eat fresh foods.  Eat more vegetables, fruits, and low-fat dairy products.  Choose whole grains. Look for the word "whole" as the first word in the ingredient list.  Choose fish and skinless chicken or [place] more often than red meat. Limit fish, poultry, and meat to 6 oz (170 g) each day.  Limit sweets, desserts, sugars, and sugary drinks.  Choose heart-healthy fats.  Limit cheese to 1 oz (28 g) per day.  Eat more home-cooked food and less restaurant, buffet, and fast food.  Limit fried foods.  Cook foods using methods other than frying.  Limit canned vegetables. If you do use them, rinse them well to decrease the sodium.  When eating at a restaurant, ask that your food be prepared with less salt, or no salt if possible. WHAT FOODS CAN I  EAT? Seek help from a dietitian for individual calorie needs. Grains Whole grain or whole wheat bread. Brown rice. Whole grain or whole wheat pasta. Quinoa, bulgur, and whole grain cereals. Low-sodium cereals. Corn or whole wheat flour tortillas. Whole grain cornbread. Whole grain crackers. Low-sodium crackers. Vegetables Fresh or frozen vegetables (raw, steamed, roasted, or grilled). Low-sodium or reduced-sodium tomato and vegetable juices. Low-sodium or reduced-sodium tomato sauce and paste. Low-sodium or reduced-sodium canned vegetables.  Fruits All fresh, canned (in natural juice), or frozen fruits. Meat and Other Protein Products Ground beef (85% or leaner), grass-fed beef, or beef trimmed of fat. Skinless chicken or [place]. Ground chicken or [place]. Pork trimmed of fat. All fish and seafood. Eggs. Dried beans, peas, or lentils. Unsalted nuts and seeds. Unsalted canned beans. Dairy Low-fat dairy products, such as skim or 1% milk, 2% or reduced-fat cheeses, low-fat ricotta or cottage cheese, or plain low-fat yogurt. Low-sodium or reduced-sodium cheeses. Fats and Oils Tub margarines without trans fats. Light or reduced-fat mayonnaise and salad dressings (reduced sodium). Avocado. Safflower, olive, or canola oils. Natural peanut or almond butter. Other Unsalted popcorn and pretzels. The items listed above may not be a complete list of recommended foods or beverages. Contact your dietitian for more options. WHAT FOODS ARE NOT RECOMMENDED? Grains White bread. White pasta. White rice. Refined cornbread. Bagels and croissants. Crackers that contain trans fat. Vegetables Creamed or fried vegetables. Vegetables in a cheese sauce. Regular canned vegetables. Regular canned tomato  sauce and paste. Regular tomato and vegetable juices. Fruits Dried fruits. Canned fruit in light or heavy syrup. Fruit juice. Meat and Other Protein Products Fatty cuts of meat. Ribs, chicken wings, bacon, sausage,  bologna, salami, chitterlings, fatback, hot dogs, bratwurst, and packaged luncheon meats. Salted nuts and seeds. Canned beans with salt. Dairy Whole or 2% milk, cream, half-and-half, and cream cheese. Whole-fat or sweetened yogurt. Full-fat cheeses or blue cheese. Nondairy creamers and whipped toppings. Processed cheese, cheese spreads, or cheese curds. Condiments Onion and garlic salt, seasoned salt, table salt, and sea salt. Canned and packaged gravies. Worcestershire sauce. Tartar sauce. Barbecue sauce. Teriyaki sauce. Soy sauce, including reduced sodium. Steak sauce. Fish sauce. Oyster sauce. Cocktail sauce. Horseradish. Ketchup and mustard. Meat flavorings and tenderizers. Bouillon cubes. Hot sauce. Tabasco sauce. Marinades. Taco seasonings. Relishes. Fats and Oils Butter, stick margarine, lard, shortening, ghee, and bacon fat. Coconut, palm kernel, or palm oils. Regular salad dressings. Other Pickles and olives. Salted popcorn and pretzels. The items listed above may not be a complete list of foods and beverages to avoid. Contact your dietitian for more information. WHERE CAN I FIND MORE INFORMATION? National Heart, Lung, and Blood Institute: travelstabloid.com   This information is not intended to replace advice given to you by your health care provider. Make sure you discuss any questions you have with your health care provider.   Document Released: 09/08/2011 Document Revised: 10/10/2014 Document Reviewed: 07/24/2013 Elsevier Interactive Patient Education [employer].

## 2016-01-19 NOTE — Progress Notes (Signed)
Subjective:    Patient ID: Austin Hill, male    DOB: [birth year], 50 y.o.   MRN: [id number]  HPI  Mr. Ochsner is a 50 year old male who presents today with a chief complaint of headache and shortness of breath. He called into team health on 04/11 with same complaints and a BP of 167/108 at [employer] while working that day.   He has no prior history of hypertension, although his BP is above goal in the clinic today at 144/92. He's checked his BP at work over the past several days with readings of 140/80, 130/90, 111/78. He's recently been experiencing increased stress at work and also with personal life. He's not had a headache since 04/11. He went home that day, took an aspirin, napped, and felt better. He denies chest pain, dizziness, visual changes, headaches.  He endorses a fair diet. Breakfast: Coffee, eggs, sausage, pancakes on the weekends. Lunch: [place] sandwich, chips, crackers Dinner: Fast food, chicken wings, pizza, pork chops, steaks, some vegetables Snacks: Occasional Honey Bun Desserts: 3 times weekly Beverages: Coffee, water, 1 soda daily, occasional sweet tea, 1-3 drinks two-three times weekly.   Exercise: He is walking 30 minutes several days weekly for the past 1 week.  Review of Systems  Eyes: Negative for visual disturbance.  Respiratory: Negative for shortness of breath.   Cardiovascular: Negative for chest pain.  Neurological: Negative for dizziness, numbness and headaches.       No past medical history on file.   Social History   Social History  . Marital Status: Divorced    Spouse Name: N/A  . Number of Children: N/A  . Years of Education: N/A   Occupational History  . Not on file.   Social History Main Topics  . Smoking status: Current Every Day Smoker -- 1.00 packs/day for 25 years    Types: Cigarettes  . Smokeless tobacco: Never Used  . Alcohol Use: 0.0 oz/week    0 Standard drinks or equivalent per week     Comment: every  couple of days  . Drug Use: No  . Sexual Activity: Not on file   Other Topics Concern  . Not on file   Social History Narrative   From [place] area.   Single, has girlfriend.   Three children. His son lives locally.   Enjoy camping, golfing, swimming.    Past Surgical History  Procedure Laterality Date  . Wrist surgery Left 31-10 years old    compound fracture  . Colonoscopy w/ biopsies  about 10 years ago    polyps    Family History  Problem Relation Age of Onset  . Aneurysm Father     Abdominal  . Aneurysm Paternal Grandfather     Abdominal     No Known Allergies  Current Outpatient Prescriptions on File Prior to Visit  Medication Sig Dispense Refill  . ibuprofen (ADVIL,MOTRIN) 200 MG tablet Take 200 mg by mouth as needed.    . Multiple Vitamin (MULTIVITAMIN) tablet Take 1 tablet by mouth daily.     No current facility-administered medications on file prior to visit.    BP 144/92 mmHg  Pulse 81  Temp(Src) 98.5 F (36.9 C) (Oral)  Ht 5\' 11"  (1.803 m)  Wt 249 lb 6.4 oz (113.127 kg)  BMI 34.80 kg/m2  SpO2 97%    Objective:   Physical Exam  Constitutional: He appears well-nourished.  Cardiovascular: Normal rate and regular rhythm.   Pulmonary/Chest: Effort normal and breath  sounds normal.  Skin: Skin is warm and dry.  Psychiatric: He has a normal mood and affect.  Endorsed a lot of personal stress at home recently          [place]:

## 2016-01-19 NOTE — Progress Notes (Signed)
Pre visit review using our clinic review tool, if applicable. No additional management support is needed unless otherwise documented below in the visit note. 

## 2016-01-19 NOTE — Assessment & Plan Note (Signed)
Recent stress at work and home life, suspect BP readings are related. BP slowly decreasing since team health call on 04/11. BP slightly above goal today. Will have him check home BP for 2 weeks. If they remain elevated, then will consider initiating medication. Discussed DASH diet as his diet consists of moderate salt content. Asymptomatic since 04/11.

## 2016-02-02 ENCOUNTER — Telehealth: Payer: Self-pay | Admitting: Primary Care

## 2016-02-02 NOTE — Telephone Encounter (Signed)
-----   Message from Pleas Koch, NP sent at 01/19/2016  9:53 AM EDT ----- Regarding: BP Will you please check on Mr. Bouknight's BP readings?

## 2016-02-02 NOTE — Telephone Encounter (Signed)
Noted  

## 2016-02-02 NOTE — Telephone Encounter (Signed)
Message left for patient to return my call.  

## 2016-02-02 NOTE — Telephone Encounter (Signed)
Patient called back. He stated that he is feeling better. BP runs around 130-135 over 85-90.

## 2017-06-19 ENCOUNTER — Ambulatory Visit (INDEPENDENT_AMBULATORY_CARE_PROVIDER_SITE_OTHER): Payer: 59 | Admitting: Primary Care

## 2017-06-19 ENCOUNTER — Encounter: Payer: Self-pay | Admitting: Primary Care

## 2017-06-19 VITALS — BP 140/86 | HR 98 | Temp 98.2°F | Ht 71.0 in | Wt 250.8 lb

## 2017-06-19 DIAGNOSIS — R030 Elevated blood-pressure reading, without diagnosis of hypertension: Secondary | ICD-10-CM | POA: Diagnosis not present

## 2017-06-19 DIAGNOSIS — E669 Obesity, unspecified: Secondary | ICD-10-CM | POA: Diagnosis not present

## 2017-06-19 DIAGNOSIS — Z0000 Encounter for general adult medical examination without abnormal findings: Secondary | ICD-10-CM

## 2017-06-19 DIAGNOSIS — Z1211 Encounter for screening for malignant neoplasm of colon: Secondary | ICD-10-CM

## 2017-06-19 DIAGNOSIS — E1165 Type 2 diabetes mellitus with hyperglycemia: Secondary | ICD-10-CM | POA: Insufficient documentation

## 2017-06-19 DIAGNOSIS — R7303 Prediabetes: Secondary | ICD-10-CM | POA: Diagnosis not present

## 2017-06-19 LAB — COMPREHENSIVE METABOLIC PANEL
ALT: 31 U/L (ref 0–53)
AST: 22 U/L (ref 0–37)
Albumin: 4.6 g/dL (ref 3.5–5.2)
Alkaline Phosphatase: 59 U/L (ref 39–117)
BUN: 11 mg/dL (ref 6–23)
CHLORIDE: 105 meq/L (ref 96–112)
CO2: 23 meq/L (ref 19–32)
Calcium: 9.8 mg/dL (ref 8.4–10.5)
Creatinine, Ser: 0.99 mg/dL (ref 0.40–1.50)
GFR: 84.19 mL/min (ref >60.00)
GLUCOSE: 110 mg/dL — AB (ref 70–99)
Potassium: 4.3 mEq/L (ref 3.5–5.1)
SODIUM: 137 meq/L (ref 135–145)
Total Bilirubin: 0.4 mg/dL (ref 0.2–1.2)
Total Protein: 7.2 g/dL (ref 6.0–8.3)

## 2017-06-19 LAB — LIPID PANEL
Cholesterol: 172 mg/dL (ref 0–200)
HDL: 58.2 mg/dL (ref >39.00)
LDL CALC: 94 mg/dL (ref 0–99)
NonHDL: 114.03
Total CHOL/HDL Ratio: 3
Triglycerides: 98 mg/dL (ref 0.0–149.0)
VLDL: 19.6 mg/dL (ref 0.0–40.0)

## 2017-06-19 LAB — HEMOGLOBIN A1C: HEMOGLOBIN A1C: 6 % (ref 4.6–6.5)

## 2017-06-19 NOTE — Assessment & Plan Note (Signed)
Noted on labs from 2016, repeat A1C due and pending. Discussed the importance of a healthy diet and regular exercise in order for weight loss, and to reduce the risk of other medical problems.

## 2017-06-19 NOTE — Assessment & Plan Note (Signed)
Td UTD, declines influenza vaccination. PSA UTD. Colonoscopy due, order placed. Discussed the importance of a healthy diet and regular exercise in order for weight loss, and to reduce the risk of other medical problems. Exam unremarkable. Labs pending. Follow up in 1 year.

## 2017-06-19 NOTE — Progress Notes (Signed)
Subjective:    Patient ID: Austin Hill, male    DOB: 1966/03/17, 51 y.o.   MRN: [id number]  HPI  Austin Hill is a 51 year old male who presents today for complete physical.  Immunizations: -Tetanus: Completed in 2016 -Influenza: Declines   Diet: He endorses a fair diet. Breakfast: Ham biscuit, fast food, coffee Lunch: Fast food Dinner: Meat, vegetables, starch Snacks: None Desserts: Occasionally  Beverages: Water, one soda daily, sweet tea, coffee  Exercise: He does not currently exercise Eye exam: Due Dental exam: Completes semi-annually  Colonoscopy:  PSA: UTD   Review of Systems  Constitutional: Negative for unexpected weight change.  HENT: Negative for rhinorrhea.   Respiratory: Negative for cough and shortness of breath.   Cardiovascular: Negative for chest pain.  Gastrointestinal: Negative for constipation and diarrhea.  Genitourinary: Negative for difficulty urinating.  Musculoskeletal: Negative for arthralgias and myalgias.  Skin: Negative for rash.  Allergic/Immunologic: Negative for environmental allergies.  Neurological: Negative for dizziness, numbness and headaches.  Psychiatric/Behavioral:       Increased stress at work and home.       No past medical history on file.   Social History   Social History  . Marital status: Divorced    Spouse name: N/A  . Number of children: N/A  . Years of education: N/A   Occupational History  . Not on file.   Social History Main Topics  . Smoking status: Current Every Day Smoker    Packs/day: 1.00    Years: 25.00    Types: Cigarettes  . Smokeless tobacco: Never Used  . Alcohol use 0.0 oz/week     Comment: every couple of days  . Drug use: No  . Sexual activity: Not on file   Other Topics Concern  . Not on file   Social History Narrative   From [place] area.   Single, has girlfriend.   Three children. Austin Hill lives locally.   Enjoy camping, golfing, swimming.    Past Surgical History:   Procedure Laterality Date  . COLONOSCOPY W/ BIOPSIES  about 10 years ago   polyps  . WRIST SURGERY Left 71-60 years old   compound fracture    Family History  Problem Relation Age of Onset  . Aneurysm Father        Abdominal  . Aneurysm Paternal Grandfather        Abdominal     No Known Allergies  Current Outpatient Prescriptions on File Prior to Visit  Medication Sig Dispense Refill  . ibuprofen (ADVIL,MOTRIN) 200 MG tablet Take 200 mg by mouth as needed.    . Multiple Vitamin (MULTIVITAMIN) tablet Take 1 tablet by mouth daily.     No current facility-administered medications on file prior to visit.     BP 140/86   Pulse 98   Temp 98.2 F (36.8 C) (Oral)   Ht 5\' 11"  (1.803 m)   Wt 250 lb 12.8 oz (113.8 kg)   SpO2 98%   BMI 34.98 kg/m    Objective:   Physical Exam  Constitutional: He is oriented to person, place, and time. He appears well-nourished.  HENT:  Right Ear: Tympanic membrane and ear canal normal.  Left Ear: Tympanic membrane and ear canal normal.  Nose: Nose normal. Right sinus exhibits no maxillary sinus tenderness and no frontal sinus tenderness. Left sinus exhibits no maxillary sinus tenderness and no frontal sinus tenderness.  Mouth/Throat: Oropharynx is clear and moist.  Eyes: Pupils are equal, round, and  reactive to light. Conjunctivae and EOM are normal.  Neck: Neck supple. Carotid bruit is not present. No thyromegaly present.  Cardiovascular: Normal rate, regular rhythm and normal heart sounds.   Pulmonary/Chest: Effort normal and breath sounds normal. He has no wheezes. He has no rales.  Abdominal: Soft. Bowel sounds are normal. There is no tenderness.  Musculoskeletal: Normal range of motion.  Neurological: He is alert and oriented to person, place, and time. He has normal reflexes. No cranial nerve deficit.  Skin: Skin is warm and dry.  Psychiatric: He has a normal mood and affect.          Assessment & Plan:

## 2017-06-19 NOTE — Assessment & Plan Note (Signed)
Discussed the importance of a healthy diet and regular exercise in order for weight loss, and to reduce the risk of other medical problems.  

## 2017-06-19 NOTE — Patient Instructions (Signed)
Complete lab work prior to leaving today. I will notify you of your results once received.   Start exercising. You should be getting 150 minutes of moderate intensity exercise weekly.  It's important to improve your diet by reducing consumption of fast food, fried food, processed snack foods, sugary drinks. Increase consumption of fresh vegetables and fruits, whole grains, water.  Ensure you are drinking 64 ounces of water daily.  Continue to monitor your blood pressure and notify me if you get numbers above 135/90 on a consistent basis. Improvement in diet and regular exercise will improve these numbers.  Follow up in 1 year for your annual exam or sooner if needed.  It was a pleasure to see you today!   DASH Eating Plan DASH stands for "Dietary Approaches to Stop Hypertension." The DASH eating plan is a healthy eating plan that has been shown to reduce high blood pressure (hypertension). It may also reduce your risk for type 2 diabetes, heart disease, and stroke. The DASH eating plan may also help with weight loss. What are tips for following this plan? General guidelines  Avoid eating more than 2,300 mg (milligrams) of salt (sodium) a day. If you have hypertension, you may need to reduce your sodium intake to 1,500 mg a day.  Limit alcohol intake to no more than 1 drink a day for nonpregnant women and 2 drinks a day for men. One drink equals 12 oz of beer, 5 oz of wine, or 1 oz of hard liquor.  Work with your health care provider to maintain a healthy body weight or to lose weight. Ask what an ideal weight is for you.  Get at least 30 minutes of exercise that causes your heart to beat faster (aerobic exercise) most days of the week. Activities may include walking, swimming, or biking.  Work with your health care provider or diet and nutrition specialist (dietitian) to adjust your eating plan to your individual calorie needs. Reading food labels  Check food labels for the amount of  sodium per serving. Choose foods with less than 5 percent of the Daily Value of sodium. Generally, foods with less than 300 mg of sodium per serving fit into this eating plan.  To find whole grains, look for the word "whole" as the first word in the ingredient list. Shopping  Buy products labeled as "low-sodium" or "no salt added."  Buy fresh foods. Avoid canned foods and premade or frozen meals. Cooking  Avoid adding salt when cooking. Use salt-free seasonings or herbs instead of table salt or sea salt. Check with your health care provider or pharmacist before using salt substitutes.  Do not fry foods. Cook foods using healthy methods such as baking, boiling, grilling, and broiling instead.  Cook with heart-healthy oils, such as olive, canola, soybean, or sunflower oil. Meal planning   Eat a balanced diet that includes: ? 5 or more servings of fruits and vegetables each day. At each meal, try to fill half of your plate with fruits and vegetables. ? Up to 6-8 servings of whole grains each day. ? Less than 6 oz of lean meat, poultry, or fish each day. A 3-oz serving of meat is about the same size as a deck of cards. One egg equals 1 oz. ? 2 servings of low-fat dairy each day. ? A serving of nuts, seeds, or beans 5 times each week. ? Heart-healthy fats. Healthy fats called Omega-3 fatty acids are found in foods such as flaxseeds and coldwater fish, like  sardines, salmon, and mackerel.  Limit how much you eat of the following: ? Canned or prepackaged foods. ? Food that is high in trans fat, such as fried foods. ? Food that is high in saturated fat, such as fatty meat. ? Sweets, desserts, sugary drinks, and other foods with added sugar. ? Full-fat dairy products.  Do not salt foods before eating.  Try to eat at least 2 vegetarian meals each week.  Eat more home-cooked food and less restaurant, buffet, and fast food.  When eating at a restaurant, ask that your food be prepared with  less salt or no salt, if possible. What foods are recommended? The items listed may not be a complete list. Talk with your dietitian about what dietary choices are best for you. Grains Whole-grain or whole-wheat bread. Whole-grain or whole-wheat pasta. Brown rice. [place]. Bulgur. Whole-grain and low-sodium cereals. Pita bread. Low-fat, low-sodium crackers. Whole-wheat flour tortillas. Vegetables Fresh or frozen vegetables (raw, steamed, roasted, or grilled). Low-sodium or reduced-sodium tomato and vegetable juice. Low-sodium or reduced-sodium tomato sauce and tomato paste. Low-sodium or reduced-sodium canned vegetables. Fruits All fresh, dried, or frozen fruit. Canned fruit in natural juice (without added sugar). Meat and other protein foods Skinless chicken or [place]. Ground chicken or [place]. Pork with fat trimmed off. Fish and seafood. Egg whites. Dried beans, peas, or lentils. Unsalted nuts, nut butters, and seeds. Unsalted canned beans. Lean cuts of beef with fat trimmed off. Low-sodium, lean deli meat. Dairy Low-fat (1%) or fat-free (skim) milk. Fat-free, low-fat, or reduced-fat cheeses. Nonfat, low-sodium ricotta or cottage cheese. Low-fat or nonfat yogurt. Low-fat, low-sodium cheese. Fats and oils Soft margarine without trans fats. Vegetable oil. Low-fat, reduced-fat, or light mayonnaise and salad dressings (reduced-sodium). Canola, safflower, olive, soybean, and sunflower oils. Avocado. Seasoning and other foods Herbs. Spices. Seasoning mixes without salt. Unsalted popcorn and pretzels. Fat-free sweets. What foods are not recommended? The items listed may not be a complete list. Talk with your dietitian about what dietary choices are best for you. Grains Baked goods made with fat, such as croissants, muffins, or some breads. Dry pasta or rice meal packs. Vegetables Creamed or fried vegetables. Vegetables in a cheese sauce. Regular canned vegetables (not low-sodium or  reduced-sodium). Regular canned tomato sauce and paste (not low-sodium or reduced-sodium). Regular tomato and vegetable juice (not low-sodium or reduced-sodium). [name]. Olives. Fruits Canned fruit in a light or heavy syrup. Fried fruit. Fruit in cream or butter sauce. Meat and other protein foods Fatty cuts of meat. Ribs. Fried meat. Berniece Salines. Sausage. Bologna and other processed lunch meats. Salami. Fatback. Hotdogs. Bratwurst. Salted nuts and seeds. Canned beans with added salt. Canned or smoked fish. Whole eggs or egg yolks. Chicken or [place] with skin. Dairy Whole or 2% milk, cream, and half-and-half. Whole or full-fat cream cheese. Whole-fat or sweetened yogurt. Full-fat cheese. Nondairy creamers. Whipped toppings. Processed cheese and cheese spreads. Fats and oils Butter. Stick margarine. Lard. Shortening. Ghee. Bacon fat. Tropical oils, such as coconut, palm kernel, or palm oil. Seasoning and other foods Salted popcorn and pretzels. Onion salt, garlic salt, seasoned salt, table salt, and sea salt. Worcestershire sauce. Tartar sauce. Barbecue sauce. Teriyaki sauce. Soy sauce, including reduced-sodium. Steak sauce. Canned and packaged gravies. Fish sauce. Oyster sauce. Cocktail sauce. Horseradish that you find on the shelf. Ketchup. Mustard. Meat flavorings and tenderizers. Bouillon cubes. Hot sauce and Tabasco sauce. Premade or packaged marinades. Premade or packaged taco seasonings. Relishes. Regular salad dressings. Where to find more information:  National Heart, Lung, and Blood Institute: https://wilson-eaton.com/  American Heart Association: www.heart.org Summary  The DASH eating plan is a healthy eating plan that has been shown to reduce high blood pressure (hypertension). It may also reduce your risk for type 2 diabetes, heart disease, and stroke.  With the DASH eating plan, you should limit salt (sodium) intake to 2,300 mg a day. If you have hypertension, you may need to reduce your sodium  intake to 1,500 mg a day.  When on the DASH eating plan, aim to eat more fresh fruits and vegetables, whole grains, lean proteins, low-fat dairy, and heart-healthy fats.  Work with your health care provider or diet and nutrition specialist (dietitian) to adjust your eating plan to your individual calorie needs. This information is not intended to replace advice given to you by your health care provider. Make sure you discuss any questions you have with your health care provider. Document Released: 09/08/2011 Document Revised: 09/12/2016 Document Reviewed: 09/12/2016 Elsevier Interactive Patient Education  2017 [employer].

## 2017-06-19 NOTE — Assessment & Plan Note (Signed)
Above goal again, also endorses some higher readings at home. Discussed hypertension and the dangers of uncontrolled levels. He will start monitoring BP regularly and report readings at or above 135/90 on a consistent basis. If above goal would recommend lisinopril 10 mg.

## 2017-06-21 ENCOUNTER — Telehealth: Payer: Self-pay | Admitting: Primary Care

## 2017-06-21 NOTE — Telephone Encounter (Signed)
Left message asking pt to call office regarding gi referral

## 2017-06-22 ENCOUNTER — Encounter: Payer: Self-pay | Admitting: *Deleted

## 2017-06-27 ENCOUNTER — Telehealth: Payer: Self-pay

## 2017-06-27 DIAGNOSIS — J209 Acute bronchitis, unspecified: Secondary | ICD-10-CM | POA: Diagnosis not present

## 2017-06-27 DIAGNOSIS — J181 Lobar pneumonia, unspecified organism: Secondary | ICD-10-CM | POA: Diagnosis not present

## 2017-06-27 NOTE — Telephone Encounter (Signed)
Pt has developed and cold and wants med called in; advised pt need to be seen to eval what type med pt would need; pt said would cb he is presently on job site.

## 2017-09-01 DIAGNOSIS — M76821 Posterior tibial tendinitis, right leg: Secondary | ICD-10-CM | POA: Diagnosis not present

## 2017-09-01 DIAGNOSIS — M71571 Other bursitis, not elsewhere classified, right ankle and foot: Secondary | ICD-10-CM | POA: Diagnosis not present

## 2017-09-01 DIAGNOSIS — M722 Plantar fascial fibromatosis: Secondary | ICD-10-CM | POA: Diagnosis not present

## 2017-10-05 ENCOUNTER — Telehealth: Payer: Self-pay | Admitting: Gastroenterology

## 2017-10-05 NOTE — Telephone Encounter (Signed)
Gastroenterology Pre-Procedure Review  Request Date:   Requesting Physician: Dr.    PATIENT REVIEW QUESTIONS: The patient responded to the following health history questions as indicated:    1. Are you having any GI issues? no 2. Do you have a personal history of Polyps? yes (yes) 3. Do you have a family history of Colon Cancer or Polyps? no 4. Diabetes Mellitus? no 5. Joint replacements in the past 12 months?no 6. Major health problems in the past 3 months?no 7. Any artificial heart valves, MVP, or defibrillator?no    MEDICATIONS & ALLERGIES:    Patient reports the following regarding taking any anticoagulation/antiplatelet therapy:   Plavix, Coumadin, Eliquis, Xarelto, Lovenox, Pradaxa, Brilinta, or Effient? no Aspirin? yes (ASA 81 mg)  Patient confirms/reports the following medications:  Current Outpatient Medications  Medication Sig Dispense Refill   ibuprofen (ADVIL,MOTRIN) 200 MG tablet Take 200 mg by mouth as needed.     Multiple Vitamin (MULTIVITAMIN) tablet Take 1 tablet by mouth daily.     No current facility-administered medications for this visit.     Patient confirms/reports the following allergies:  No Known Allergies  No orders of the defined types were placed in this encounter.   AUTHORIZATION INFORMATION Primary Insurance: 1D#: Group #:  Secondary Insurance: 1D#: Group #:  SCHEDULE INFORMATION: Date:  Time: Location:

## 2017-10-11 ENCOUNTER — Other Ambulatory Visit: Payer: Self-pay

## 2017-10-11 DIAGNOSIS — Z1211 Encounter for screening for malignant neoplasm of colon: Secondary | ICD-10-CM

## 2017-10-26 ENCOUNTER — Encounter
Admission: RE | Disposition: A | Discharge status: Home / Self Care | Payer: Self-pay | Source: Ambulatory Visit | Attending: Gastroenterology

## 2017-10-26 ENCOUNTER — Encounter: Payer: Self-pay | Admitting: *Deleted

## 2017-10-26 ENCOUNTER — Ambulatory Visit: Payer: 59 | Admitting: Registered Nurse

## 2017-10-26 ENCOUNTER — Ambulatory Visit
Admission: RE | Admit: 2017-10-26 | Discharge: 2017-10-26 | Disposition: A | Discharge status: Home / Self Care | Payer: 59 | Source: Ambulatory Visit | Attending: Gastroenterology | Admitting: Gastroenterology

## 2017-10-26 DIAGNOSIS — D122 Benign neoplasm of ascending colon: Secondary | ICD-10-CM | POA: Insufficient documentation

## 2017-10-26 DIAGNOSIS — Z79899 Other long term (current) drug therapy: Secondary | ICD-10-CM | POA: Insufficient documentation

## 2017-10-26 DIAGNOSIS — Z86010 Personal history of colonic polyps: Secondary | ICD-10-CM | POA: Diagnosis not present

## 2017-10-26 DIAGNOSIS — K5730 Diverticulosis of large intestine without perforation or abscess without bleeding: Secondary | ICD-10-CM | POA: Insufficient documentation

## 2017-10-26 DIAGNOSIS — Z1211 Encounter for screening for malignant neoplasm of colon: Secondary | ICD-10-CM | POA: Diagnosis not present

## 2017-10-26 DIAGNOSIS — Z8249 Family history of ischemic heart disease and other diseases of the circulatory system: Secondary | ICD-10-CM | POA: Insufficient documentation

## 2017-10-26 DIAGNOSIS — D125 Benign neoplasm of sigmoid colon: Secondary | ICD-10-CM | POA: Diagnosis not present

## 2017-10-26 DIAGNOSIS — K640 First degree hemorrhoids: Secondary | ICD-10-CM | POA: Diagnosis not present

## 2017-10-26 DIAGNOSIS — K649 Unspecified hemorrhoids: Secondary | ICD-10-CM | POA: Diagnosis not present

## 2017-10-26 DIAGNOSIS — K635 Polyp of colon: Secondary | ICD-10-CM | POA: Diagnosis not present

## 2017-10-26 DIAGNOSIS — F17210 Nicotine dependence, cigarettes, uncomplicated: Secondary | ICD-10-CM | POA: Insufficient documentation

## 2017-10-26 DIAGNOSIS — D123 Benign neoplasm of transverse colon: Secondary | ICD-10-CM | POA: Insufficient documentation

## 2017-10-26 HISTORY — PX: COLONOSCOPY WITH PROPOFOL: SHX5780

## 2017-10-26 SURGERY — COLONOSCOPY WITH PROPOFOL
Anesthesia: General

## 2017-10-26 MED ORDER — PROPOFOL 10 MG/ML IV BOLUS
INTRAVENOUS | Status: DC | PRN
  Administered 2017-10-26: 20 mg via INTRAVENOUS
  Administered 2017-10-26: 10 mg via INTRAVENOUS
  Administered 2017-10-26: 100 mg via INTRAVENOUS
  Administered 2017-10-26: 20 mg via INTRAVENOUS

## 2017-10-26 MED ORDER — PHENYLEPHRINE HCL 10 MG/ML IJ SOLN
INTRAMUSCULAR | Status: DC | PRN
  Administered 2017-10-26 (×2): 100 ug via INTRAVENOUS

## 2017-10-26 MED ORDER — PROPOFOL 500 MG/50ML IV EMUL
INTRAVENOUS | Status: DC | PRN
  Administered 2017-10-26: 150 ug/kg/min via INTRAVENOUS

## 2017-10-26 MED ORDER — MIDAZOLAM HCL 2 MG/2ML IJ SOLN
INTRAMUSCULAR | Status: AC
  Filled 2017-10-26: qty 2

## 2017-10-26 MED ORDER — LIDOCAINE HCL (PF) 2 % IJ SOLN
INTRAMUSCULAR | Status: AC
  Filled 2017-10-26: qty 10

## 2017-10-26 MED ORDER — PROPOFOL 500 MG/50ML IV EMUL
INTRAVENOUS | Status: AC
  Filled 2017-10-26: qty 50

## 2017-10-26 MED ORDER — PROPOFOL 10 MG/ML IV BOLUS
INTRAVENOUS | Status: AC
  Filled 2017-10-26: qty 20

## 2017-10-26 MED ORDER — LIDOCAINE HCL (CARDIAC) 20 MG/ML IV SOLN
INTRAVENOUS | Status: DC | PRN
  Administered 2017-10-26: 40 mg via INTRAVENOUS

## 2017-10-26 MED ORDER — MIDAZOLAM HCL 2 MG/2ML IJ SOLN
INTRAMUSCULAR | Status: DC | PRN
  Administered 2017-10-26: 2 mg via INTRAVENOUS

## 2017-10-26 MED ORDER — SODIUM CHLORIDE 0.9 % IV SOLN
INTRAVENOUS | Status: DC
  Administered 2017-10-26: 1000 mL via INTRAVENOUS

## 2017-10-26 NOTE — Op Note (Signed)
[facility] Gastroenterology Patient Name: Austin Hill Procedure Date: 10/26/2017 9:57 AM MRN: [id number] Account #: [id number] Date of Birth: May 07, 1966 Admit Type: Outpatient Age: 52 Room: [facility] ENDO ROOM 3 Gender: Male Note Status: Finalized Procedure:            Colonoscopy Indications:          High risk colon cancer surveillance: Personal history                        of colonic polyps Providers:            Jonathon Bellows MD, MD Referring MD:         Pleas Koch (Referring MD) Medicines:            Monitored Anesthesia Care Complications:        No immediate complications. Procedure:            Pre-Anesthesia Assessment:                       - Prior to the procedure, a History and Physical was                        performed, and patient medications, allergies and                        sensitivities were reviewed. The patient's tolerance of                        previous anesthesia was reviewed.                       - The risks and benefits of the procedure and the                        sedation options and risks were discussed with the                        patient. All questions were answered and informed                        consent was obtained.                       - ASA Grade Assessment: III - A patient with severe                        systemic disease.                       After obtaining informed consent, the colonoscope was                        passed under direct vision. Throughout the procedure,                        the patient's blood pressure, pulse, and oxygen                        saturations were monitored continuously. The                        Colonoscope  was introduced through the anus and                        advanced to the the cecum, identified by the                        appendiceal orifice, IC valve and transillumination.                        The colonoscopy was performed with ease. The patient                    tolerated the procedure well. The quality of the bowel                        preparation was adequate to identify polyps. Findings:      Non-bleeding internal hemorrhoids were found during retroflexion. The       hemorrhoids were medium-sized and Grade I (internal hemorrhoids that do       not prolapse).      Two sessile polyps were found in the ascending colon. The polyps were 5       to 7 mm in size. These polyps were removed with a cold snare. Resection       and retrieval were complete.      A 3 mm polyp was found in the ascending colon. The polyp was sessile.       The polyp was removed with a cold biopsy forceps. Resection and       retrieval were complete.      A 18 mm polyp was found in the transverse colon. The polyp was sessile.       Preparations were made for mucosal resection. Eleview was injected to       raise the lesion. Snare mucosal resection was performed. Resection and       retrieval were complete. To prevent bleeding after the polypectomy, two       hemostatic clips were successfully placed. There was no bleeding during,       or at the end, of the procedure. The polyp was resected piece meal      A 2 mm polyp was found in the transverse colon. The polyp was sessile.       The polyp was removed with a cold snare. Resection and retrieval were       complete.      Two sessile polyps were found in the sigmoid colon. The polyps were 5 to       7 mm in size. These polyps were removed with a cold snare. Resection and       retrieval were complete.      Multiple small-mouthed diverticula were found in the sigmoid colon.      The exam was otherwise without abnormality on direct and retroflexion       views. Impression:           - Non-bleeding internal hemorrhoids.                       - Two 5 to 7 mm polyps in the ascending colon, removed                        with a cold snare. Resected and retrieved.                       -  One 3 mm polyp in the  ascending colon, removed with a                        cold biopsy forceps. Resected and retrieved.                       - One 18 mm polyp in the transverse colon, removed with                        mucosal resection. Resected and retrieved. Clips were                        placed.                       - One 2 mm polyp in the transverse colon, removed with                        a cold snare. Resected and retrieved.                       - Two 5 to 7 mm polyps in the sigmoid colon, removed                        with a cold snare. Resected and retrieved.                       - Diverticulosis in the sigmoid colon.                       - The examination was otherwise normal on direct and                        retroflexion views.                       - Mucosal resection was performed. Resection and                        retrieval were complete. Recommendation:       - Discharge patient to home (with escort).                       - Resume previous diet.                       - No ibuprofen, naproxen, or other non-steroidal                        anti-inflammatory drugs for 6 weeks after polyp removal.                       - Await pathology results.                       - Repeat colonoscopy in 6 months for surveillance after                        piecemeal polypectomy. Procedure Code(s):    --- Professional ---  21224, 89, Colonoscopy, flexible; with endoscopic                        mucosal resection                       [phone], Colonoscopy, flexible; with removal of tumor(s),                        polyp(s), or other lesion(s) by snare technique                       45380, 50, Colonoscopy, flexible; with biopsy, single                        or multiple Diagnosis Code(s):    --- Professional ---                       Z86.010, Personal history of colonic polyps                       D12.2, Benign neoplasm of ascending colon                       D12.5,  Benign neoplasm of sigmoid colon                       D12.3, Benign neoplasm of transverse colon (hepatic                        flexure or splenic flexure)                       K64.0, First degree hemorrhoids                       K57.30, Diverticulosis of large intestine without                        perforation or abscess without bleeding CPT copyright 2016 American Medical Association. All rights reserved. The codes documented in this report are preliminary and upon coder review may  be revised to meet current compliance requirements. Jonathon Bellows, MD Jonathon Bellows MD, MD 10/26/2017 10:50:40 AM This report has been signed electronically. Number of Addenda: 0 Note Initiated On: 10/26/2017 9:57 AM Scope Withdrawal Time: 0 hours 37 minutes 26 seconds  Total Procedure Duration: 0 hours 42 minutes 35 seconds       [facility]

## 2017-10-26 NOTE — H&P (Signed)
Jonathon Bellows, MD [address] [place], [place], [place], [address] [address], [place], [place], [place], [address] Phone: [phone]  Fax: [phone]  Primary Care Physician:  Pleas Koch, NP   Pre-Procedure History & Physical: HPI:  Austin Hill is a 52 y.o. male is here for an colonoscopy.   History reviewed. No pertinent past medical history.  Past Surgical History:  Procedure Laterality Date  . COLONOSCOPY W/ BIOPSIES  about 10 years ago   polyps  . WRIST SURGERY Left 86-56 years old   compound fracture    Prior to Admission medications   Medication Sig Start Date End Date Taking? Authorizing Provider  ibuprofen (ADVIL,MOTRIN) 200 MG tablet Take 200 mg by mouth as needed.   Yes [provider]  Multiple Vitamin (MULTIVITAMIN) tablet Take 1 tablet by mouth daily.   Yes [provider]    Allergies as of 10/11/2017  . (No Known Allergies)    Family History  Problem Relation Age of Onset  . Aneurysm Father        Abdominal  . Aneurysm Paternal Grandfather        Abdominal     Social History   Socioeconomic History  . Marital status: Divorced    Spouse name: Not on file  . Number of children: Not on file  . Years of education: Not on file  . Highest education level: Not on file  Social Needs  . Financial resource strain: Not on file  . Food insecurity - worry: Not on file  . Food insecurity - inability: Not on file  . Transportation needs - medical: Not on file  . Transportation needs - non-medical: Not on file  Occupational History  . Not on file  Tobacco Use  . Smoking status: Current Every Day Smoker    Packs/day: 1.00    Years: 25.00    Pack years: 25.00    Types: Cigarettes  . Smokeless tobacco: Never Used  Substance and Sexual Activity  . Alcohol use: Yes    Alcohol/week: 0.0 oz    Comment: every couple of days  . Drug use: No  . Sexual activity: Not on file  Other Topics Concern  . Not on file    Social History Narrative   From [place] area.   Single, has girlfriend.   Three children. His son lives locally.   Enjoy camping, golfing, swimming.    Review of Systems: See HPI, otherwise negative ROS  Physical Exam: BP (!) 133/95   Pulse 96   Temp (!) 96 F (35.6 C) (Tympanic)   Resp 20   Ht 5\' 11"  (1.803 m)   Wt 250 lb (113.4 kg)   SpO2 96%   BMI 34.87 kg/m  General:   Alert,  pleasant and cooperative in NAD Head:  Normocephalic and atraumatic. Neck:  Supple; no masses or thyromegaly. Lungs:  Clear throughout to auscultation, normal respiratory effort.    Heart:  +S1, +S2, Regular rate and rhythm, No edema. Abdomen:  Soft, nontender and nondistended. Normal bowel sounds, without guarding, and without rebound.   Neurologic:  Alert and  oriented x4;  grossly normal neurologically.  Impression/Plan: Austin Hill is here for an colonoscopy to be performed for surveillance due to prior history of colon polyps   Risks, benefits, limitations, and alternatives regarding  colonoscopy have been reviewed with the patient.  Questions have been answered.  All parties agreeable.   Jonathon Bellows, MD  10/26/2017, 9:12 AM

## 2017-10-26 NOTE — Anesthesia Postprocedure Evaluation (Signed)
Anesthesia Post Note  Patient: Austin Hill  Procedure(s) Performed: COLONOSCOPY WITH PROPOFOL (N/A )  Patient location during evaluation: Endoscopy Anesthesia Type: General Level of consciousness: awake and alert Pain management: pain level controlled Vital Signs Assessment: post-procedure vital signs reviewed and stable Respiratory status: spontaneous breathing, nonlabored ventilation, respiratory function stable and patient connected to nasal cannula oxygen Cardiovascular status: blood pressure returned to baseline and stable Postop Assessment: no apparent nausea or vomiting Anesthetic complications: no     Last Vitals:  Vitals:   10/26/17 1053 10/26/17 1102  BP: 98/61 98/74  Pulse: 74 67  Resp: 19 16  Temp: (!) 36.1 C   SpO2: 98% 99%    Last Pain:  Vitals:   10/26/17 1053  TempSrc: Tympanic                 Precious Haws Piscitello

## 2017-10-26 NOTE — Anesthesia Post-op Follow-up Note (Signed)
Anesthesia QCDR form completed.        

## 2017-10-26 NOTE — Transfer of Care (Signed)
Immediate Anesthesia Transfer of Care Note  Patient: Austin Hill  Procedure(s) Performed: Procedure(s): COLONOSCOPY WITH PROPOFOL (N/A)  Patient Location: PACU and Endoscopy Unit  Anesthesia Type:General  Level of Consciousness: sedated  Airway & Oxygen Therapy: Patient Spontanous Breathing and Patient connected to nasal cannula oxygen  Post-op Assessment: Report given to RN and Post -op Vital signs reviewed and stable  Post vital signs: Reviewed and stable  Last Vitals:  Vitals:   10/26/17 1040 10/26/17 1053  BP: 98/61 98/61  Pulse: 74 74  Resp: 20 19  Temp: (!) 35.7 C (!) 36.1 C  SpO2: 51% 70%    Complications: No apparent anesthesia complications

## 2017-10-26 NOTE — Anesthesia Procedure Notes (Signed)
Date/Time: 10/26/2017 9:58 AM Performed by: Doreen Salvage, CRNA Pre-anesthesia Checklist: Patient identified, Emergency Drugs available, Suction available and Patient being monitored Patient Re-evaluated:Patient Re-evaluated prior to induction Oxygen Delivery Method: Nasal cannula Induction Type: IV induction Dental Injury: Teeth and Oropharynx as per pre-operative assessment  Comments: Nasal cannula with etCO2 monitoring

## 2017-10-26 NOTE — Anesthesia Preprocedure Evaluation (Signed)
Anesthesia Evaluation  Patient identified by MRN, date of birth, ID band Patient awake    Reviewed: Allergy & Precautions, H&P , NPO status , Patient's Chart, lab work & pertinent test results  History of Anesthesia Complications Negative for: history of anesthetic complications  Airway Mallampati: III  TM Distance: <3 FB Neck ROM: limited    Dental  (+) Chipped, Poor Dentition   Pulmonary neg shortness of breath, COPD, Current Smoker,           Cardiovascular Exercise Tolerance: Good (-) angina(-) Past MI and (-) PND negative cardio ROS       Neuro/Psych negative neurological ROS  negative psych ROS   GI/Hepatic negative GI ROS, Neg liver ROS, neg GERD  ,  Endo/Other  negative endocrine ROS  Renal/GU negative Renal ROS  negative genitourinary   Musculoskeletal   Abdominal   Peds  Hematology negative hematology ROS (+)   Anesthesia Other Findings History reviewed. No pertinent past medical history.  Past Surgical History: about 10 years ago: COLONOSCOPY W/ BIOPSIES     Comment:  polyps 3-54 years old: WRIST SURGERY; Left     Comment:  compound fracture  BMI    Body Mass Index:  34.87 kg/m      Reproductive/Obstetrics negative OB ROS                             Anesthesia Physical Anesthesia Plan  ASA: III  Anesthesia Plan: General   Post-op Pain Management:    Induction: Intravenous  PONV Risk Score and Plan: Propofol infusion  Airway Management Planned: Natural Airway and Nasal Cannula  Additional Equipment:   Intra-op Plan:   Post-operative Plan:   Informed Consent: I have reviewed the patients History and Physical, chart, labs and discussed the procedure including the risks, benefits and alternatives for the proposed anesthesia with the patient or authorized representative who has indicated his/her understanding and acceptance.   Dental Advisory  Given  Plan Discussed with: Anesthesiologist, CRNA and Surgeon  Anesthesia Plan Comments: (Patient consented for risks of anesthesia including but not limited to:  - adverse reactions to medications - risk of intubation if required - damage to teeth, lips or other oral mucosa - sore throat or hoarseness - Damage to heart, brain, lungs or loss of life  Patient voiced understanding.)        Anesthesia Quick Evaluation

## 2017-10-27 ENCOUNTER — Encounter: Payer: Self-pay | Admitting: Gastroenterology

## 2017-10-30 LAB — SURGICAL PATHOLOGY

## 2017-11-02 ENCOUNTER — Telehealth: Payer: Self-pay

## 2017-11-02 NOTE — Telephone Encounter (Signed)
Advised patient of results per Dr. Vicente Males.    - infrom results of biopsy showed adenomas- transverse colon polyp was taken out in multiple pieces hence needs repeat colonoscopyin 6 months  Patient requested a reminder call.

## 2018-02-19 ENCOUNTER — Other Ambulatory Visit: Payer: Self-pay

## 2018-02-19 NOTE — Progress Notes (Signed)
Called to remind patient of repeat colonoscopy being due in June.   Repeat due to piecemeal polyp removal.   LVM for patient callback to schedule colonoscopy.

## 2018-05-18 ENCOUNTER — Encounter: Payer: Self-pay | Admitting: Primary Care

## 2018-05-18 ENCOUNTER — Ambulatory Visit: Payer: 59 | Admitting: Primary Care

## 2018-05-18 VITALS — BP 144/90 | HR 81 | Temp 98.2°F | Wt 240.0 lb

## 2018-05-18 DIAGNOSIS — R42 Dizziness and giddiness: Secondary | ICD-10-CM | POA: Diagnosis not present

## 2018-05-18 DIAGNOSIS — R7303 Prediabetes: Secondary | ICD-10-CM | POA: Diagnosis not present

## 2018-05-18 DIAGNOSIS — I10 Essential (primary) hypertension: Secondary | ICD-10-CM | POA: Diagnosis not present

## 2018-05-18 DIAGNOSIS — Z8249 Family history of ischemic heart disease and other diseases of the circulatory system: Secondary | ICD-10-CM | POA: Diagnosis not present

## 2018-05-18 LAB — CBC
HEMATOCRIT: 47.3 % (ref 39.0–52.0)
HEMOGLOBIN: 16.7 g/dL (ref 13.0–17.0)
MCHC: 35.2 g/dL (ref 30.0–36.0)
MCV: 95.8 fl (ref 78.0–100.0)
PLATELETS: 215 10*3/uL (ref 150.0–400.0)
RBC: 4.94 Mil/uL (ref 4.22–5.81)
RDW: 13.2 % (ref 11.5–15.5)
WBC: 5.5 10*3/uL (ref 4.0–10.5)

## 2018-05-18 LAB — COMPREHENSIVE METABOLIC PANEL
ALT: 25 U/L (ref 0–53)
AST: 20 U/L (ref 0–37)
Albumin: 4.4 g/dL (ref 3.5–5.2)
Alkaline Phosphatase: 47 U/L (ref 39–117)
BUN: 13 mg/dL (ref 6–23)
CHLORIDE: 105 meq/L (ref 96–112)
CO2: 26 mEq/L (ref 19–32)
Calcium: 9.6 mg/dL (ref 8.4–10.5)
Creatinine, Ser: 1.07 mg/dL (ref 0.40–1.50)
GFR: 76.7 mL/min (ref >60.00)
GLUCOSE: 119 mg/dL — AB (ref 70–99)
Potassium: 3.9 mEq/L (ref 3.5–5.1)
SODIUM: 137 meq/L (ref 135–145)
Total Bilirubin: 1 mg/dL (ref 0.2–1.2)
Total Protein: 6.8 g/dL (ref 6.0–8.3)

## 2018-05-18 LAB — LIPID PANEL
Cholesterol: 176 mg/dL (ref 0–200)
HDL: 55.8 mg/dL (ref >39.00)
LDL CALC: 93 mg/dL (ref 0–99)
NONHDL: 120.65
Total CHOL/HDL Ratio: 3
Triglycerides: 137 mg/dL (ref 0.0–149.0)
VLDL: 27.4 mg/dL (ref 0.0–40.0)

## 2018-05-18 LAB — HEMOGLOBIN A1C: Hgb A1c MFr Bld: 6.1 % (ref 4.6–6.5)

## 2018-05-18 MED ORDER — LISINOPRIL 10 MG PO TABS: 10.0000 mg | ORAL_TABLET | Freq: Every day | ORAL | 0 refills | Status: DC

## 2018-05-18 NOTE — Assessment & Plan Note (Signed)
Repeat A1C pending. 

## 2018-05-18 NOTE — Assessment & Plan Note (Signed)
Several documented elevated readings. Suspect some elevation to be secondary to stress, other symptoms of fatigue and "not feeling well" could be from uncontrolled hypertension.  Rx for lisinopril 10 mg sent to pharmacy. Will have him start monitoring BP and follow up in 2-3 weeks for BP check.   Check [place] for AAA given hypertension, current tobacco use, and family history of AAA in father and paternal uncle. tobacco abuse.

## 2018-05-18 NOTE — Progress Notes (Addendum)
Subjective:    Patient ID: Austin Hill, male    DOB: July 23, 1966, 52 y.o.   MRN: [id number]  HPI  Austin Hill is a 52 year old male with a history of tobacco abuse, elevated blood pressure reading, prediabetes who presents today with a chief complaint of dizziness.   He also reports low energy, increased stress, anxiety, panic attacks. His symptoms began 2 weeks ago. He was traveling in [place] last week and felt panicked when going in and out of the tunnels, felt like he was going to pass out and had to pull over at a rest stop. Felt improved after resting.   He's checking his BP at home infrequently and is getting readings of 130-140/?. This morning his home BP was 159/118. He's been very stressed at work and personal life. His symptoms have resolved since Monday this week. He has a family history of hypertension in his brother. His father and paternal uncle passed away from an aortic aneurysm. He is smoking 1-1.5 PPD. He denies exertional dyspnea, chest pain, palpitations, dizziness, nausea, abdominal pain.   BP Readings from Last 3 Encounters:  05/18/18 (!) 144/90  10/26/17 98/74  06/19/17 140/86     Review of Systems  Constitutional: Negative for fever and unexpected weight change.  Eyes: Negative for visual disturbance.  Respiratory: Negative for shortness of breath.   Cardiovascular: Negative for chest pain.  Gastrointestinal: Negative for abdominal pain and nausea.  Neurological: Positive for light-headedness.  Psychiatric/Behavioral:       Increased stress, anxiety       No past medical history on file.   Social History   Socioeconomic History  . Marital status: Divorced    Spouse name: Not on file  . Number of children: Not on file  . Years of education: Not on file  . Highest education level: Not on file  Occupational History  . Not on file  Social Needs  . Financial resource strain: Not on file  . Food insecurity:    Worry: Not on file   Inability: Not on file  . Transportation needs:    Medical: Not on file    Non-medical: Not on file  Tobacco Use  . Smoking status: Current Every Day Smoker    Packs/day: 1.00    Years: 25.00    Pack years: 25.00    Types: Cigarettes  . Smokeless tobacco: Never Used  Substance and Sexual Activity  . Alcohol use: Yes    Alcohol/week: 0.0 standard drinks    Comment: every couple of days  . Drug use: No  . Sexual activity: Not on file  Lifestyle  . Physical activity:    Days per week: Not on file    Minutes per session: Not on file  . Stress: Not on file  Relationships  . Social connections:    Talks on phone: Not on file    Gets together: Not on file    Attends religious service: Not on file    Active member of club or organization: Not on file    Attends meetings of clubs or organizations: Not on file    Relationship status: Not on file  . Intimate partner violence:    Fear of current or ex partner: Not on file    Emotionally abused: Not on file    Physically abused: Not on file    Forced sexual activity: Not on file  Other Topics Concern  . Not on file  Social History Narrative  From [place] area.   Single, has girlfriend.   Three children. His son lives locally.   Enjoy camping, golfing, swimming.    Past Surgical History:  Procedure Laterality Date  . COLONOSCOPY W/ BIOPSIES  about 10 years ago   polyps  . COLONOSCOPY WITH PROPOFOL N/A 10/26/2017   Procedure: COLONOSCOPY WITH PROPOFOL;  Surgeon: Jonathon Bellows, MD;  Location: [facility] ENDOSCOPY;  Service: Gastroenterology;  Laterality: N/A;  . WRIST SURGERY Left 65-40 years old   compound fracture    Family History  Problem Relation Age of Onset  . Aneurysm Father        Abdominal  . Aneurysm Paternal Grandfather        Abdominal     No Known Allergies  Current Outpatient Medications on File Prior to Visit  Medication Sig Dispense Refill  . ibuprofen (ADVIL,MOTRIN) 200 MG tablet Take 200 mg by mouth as  needed.    . Multiple Vitamin (MULTIVITAMIN) tablet Take 1 tablet by mouth daily.     No current facility-administered medications on file prior to visit.     BP (!) 144/90   Pulse 81   Temp 98.2 F (36.8 C) (Oral)   Wt 240 lb (108.9 kg)   SpO2 97%   BMI 33.47 kg/m    Objective:   Physical Exam  Constitutional: He appears well-nourished.  Neck: Neck supple.  Cardiovascular: Normal rate and regular rhythm.  Respiratory: Effort normal and breath sounds normal.  Skin: Skin is warm and dry.  Psychiatric: He has a normal mood and affect.           Assessment & Plan:  Dizziness:  Occurred when going in and out of tunnels while traveling in [place]. Could be from anxiety. Other causes include hypertension, hyperglycemia. Check A1C, CBC, CMP, Lipids. ECG today with NSR rate of 75, t-wave inversion to V2, no ST elevation. No old to compare. No acute episode noted.  Treat hypertension today. [place] for AAA pending.  Austin Koch, NP

## 2018-05-18 NOTE — Patient Instructions (Addendum)
Stop by the lab prior to leaving today. I will notify you of your results once received.   Stop by the front desk and speak with either Austin Hill regarding your ultrasound for aneurysm.   Start lisinopril 10 mg tablets for high blood pressure. Take 1 tablet by mouth once daily.   Start monitoring your blood pressure daily, around the same time of day, for the next 2 weeks.  Ensure that you have rested for 30 minutes prior to checking your blood pressure. Record your readings and bring them to your next visit.  Schedule a follow up visit in 2-3 weeks for blood pressure check.  Please go to the hospital if you develop chest pain with dizziness, radiation of pain down the left arm, weakness.  It was a pleasure to see you today!   DASH Eating Plan DASH stands for "Dietary Approaches to Stop Hypertension." The DASH eating plan is a healthy eating plan that has been shown to reduce high blood pressure (hypertension). It may also reduce your risk for type 2 diabetes, heart disease, and stroke. The DASH eating plan may also help with weight loss. What are tips for following this plan? General guidelines  Avoid eating more than 2,300 mg (milligrams) of salt (sodium) a day. If you have hypertension, you may need to reduce your sodium intake to 1,500 mg a day.  Limit alcohol intake to no more than 1 drink a day for nonpregnant women and 2 drinks a day for men. One drink equals 12 oz of beer, 5 oz of wine, or 1 oz of hard liquor.  Work with your health care provider to maintain a healthy body weight or to lose weight. Ask what an ideal weight is for you.  Get at least 30 minutes of exercise that causes your heart to beat faster (aerobic exercise) most days of the week. Activities may include walking, swimming, or biking.  Work with your health care provider or diet and nutrition specialist (dietitian) to adjust your eating plan to your individual calorie needs. Reading food labels  Check food labels  for the amount of sodium per serving. Choose foods with less than 5 percent of the Daily Value of sodium. Generally, foods with less than 300 mg of sodium per serving fit into this eating plan.  To find whole grains, look for the word "whole" as the first word in the ingredient list. Shopping  Buy products labeled as "low-sodium" or "no salt added."  Buy fresh foods. Avoid canned foods and premade or frozen meals. Cooking  Avoid adding salt when cooking. Use salt-free seasonings or herbs instead of table salt or sea salt. Check with your health care provider or pharmacist before using salt substitutes.  Do not fry foods. Cook foods using healthy methods such as baking, boiling, grilling, and broiling instead.  Cook with heart-healthy oils, such as olive, canola, soybean, or sunflower oil. Meal planning   Eat a balanced diet that includes: ? 5 or more servings of fruits and vegetables each day. At each meal, try to fill half of your plate with fruits and vegetables. ? Up to 6-8 servings of whole grains each day. ? Less than 6 oz of lean meat, poultry, or fish each day. A 3-oz serving of meat is about the same size as a deck of cards. One egg equals 1 oz. ? 2 servings of low-fat dairy each day. ? A serving of nuts, seeds, or beans 5 times each week. ? Heart-healthy fats. Healthy fats  called Omega-3 fatty acids are found in foods such as flaxseeds and coldwater fish, like sardines, salmon, and mackerel.  Limit how much you eat of the following: ? Canned or prepackaged foods. ? Food that is high in trans fat, such as fried foods. ? Food that is high in saturated fat, such as fatty meat. ? Sweets, desserts, sugary drinks, and other foods with added sugar. ? Full-fat dairy products.  Do not salt foods before eating.  Try to eat at least 2 vegetarian meals each week.  Eat more home-cooked food and less restaurant, buffet, and fast food.  When eating at a restaurant, ask that your food  be prepared with less salt or no salt, if possible. What foods are recommended? The items listed may not be a complete list. Talk with your dietitian about what dietary choices are best for you. Grains Whole-grain or whole-wheat bread. Whole-grain or whole-wheat pasta. Brown rice. Austin Hill. Bulgur. Whole-grain and low-sodium cereals. Pita bread. Low-fat, low-sodium crackers. Whole-wheat flour tortillas. Vegetables Fresh or frozen vegetables (raw, steamed, roasted, or grilled). Low-sodium or reduced-sodium tomato and vegetable juice. Low-sodium or reduced-sodium tomato sauce and tomato paste. Low-sodium or reduced-sodium canned vegetables. Fruits All fresh, dried, or frozen fruit. Canned fruit in natural juice (without added sugar). Meat and other protein foods Skinless chicken or [place]. Ground chicken or [place]. Pork with fat trimmed off. Fish and seafood. Egg whites. Dried beans, peas, or lentils. Unsalted nuts, nut butters, and seeds. Unsalted canned beans. Lean cuts of beef with fat trimmed off. Low-sodium, lean deli meat. Dairy Low-fat (1%) or fat-free (skim) milk. Fat-free, low-fat, or reduced-fat cheeses. Nonfat, low-sodium ricotta or cottage cheese. Low-fat or nonfat yogurt. Low-fat, low-sodium cheese. Fats and oils Soft margarine without trans fats. Vegetable oil. Low-fat, reduced-fat, or light mayonnaise and salad dressings (reduced-sodium). Canola, safflower, olive, soybean, and sunflower oils. Avocado. Seasoning and other foods Herbs. Spices. Seasoning mixes without salt. Unsalted popcorn and pretzels. Fat-free sweets. What foods are not recommended? The items listed may not be a complete list. Talk with your dietitian about what dietary choices are best for you. Grains Baked goods made with fat, such as croissants, muffins, or some breads. Dry pasta or rice meal packs. Vegetables Creamed or fried vegetables. Vegetables in a cheese sauce. Regular canned vegetables (not  low-sodium or reduced-sodium). Regular canned tomato sauce and paste (not low-sodium or reduced-sodium). Regular tomato and vegetable juice (not low-sodium or reduced-sodium). Austin Hill. Olives. Fruits Canned fruit in a light or heavy syrup. Fried fruit. Fruit in cream or butter sauce. Meat and other protein foods Fatty cuts of meat. Ribs. Fried meat. Berniece Salines. Sausage. Bologna and other processed lunch meats. Salami. Fatback. Hotdogs. Bratwurst. Salted nuts and seeds. Canned beans with added salt. Canned or smoked fish. Whole eggs or egg yolks. Chicken or [place] with skin. Dairy Whole or 2% milk, cream, and half-and-half. Whole or full-fat cream cheese. Whole-fat or sweetened yogurt. Full-fat cheese. Nondairy creamers. Whipped toppings. Processed cheese and cheese spreads. Fats and oils Butter. Stick margarine. Lard. Shortening. Ghee. Bacon fat. Tropical oils, such as coconut, palm kernel, or palm oil. Seasoning and other foods Salted popcorn and pretzels. Onion salt, garlic salt, seasoned salt, table salt, and sea salt. Worcestershire sauce. Tartar sauce. Barbecue sauce. Teriyaki sauce. Soy sauce, including reduced-sodium. Steak sauce. Canned and packaged gravies. Fish sauce. Oyster sauce. Cocktail sauce. Horseradish that you find on the shelf. Ketchup. Mustard. Meat flavorings and tenderizers. Bouillon cubes. Hot sauce and Tabasco sauce. Premade or packaged marinades.  Premade or packaged taco seasonings. Relishes. Regular salad dressings. Where to find more information:  National Heart, Lung, and [place]: https://wilson-eaton.com/  American Heart Association: www.heart.org Summary  The DASH eating plan is a healthy eating plan that has been shown to reduce high blood pressure (hypertension). It may also reduce your risk for type 2 diabetes, heart disease, and stroke.  With the DASH eating plan, you should limit salt (sodium) intake to 2,300 mg a day. If you have hypertension, you may need to reduce  your sodium intake to 1,500 mg a day.  When on the DASH eating plan, aim to eat more fresh fruits and vegetables, whole grains, lean proteins, low-fat dairy, and heart-healthy fats.  Work with your health care provider or diet and nutrition specialist (dietitian) to adjust your eating plan to your individual calorie needs. This information is not intended to replace advice given to you by your health care provider. Make sure you discuss any questions you have with your health care provider. Document Released: 09/08/2011 Document Revised: 09/12/2016 Document Reviewed: 09/12/2016 Elsevier Interactive Patient Education  [employer].

## 2018-05-18 NOTE — Assessment & Plan Note (Signed)
In father and paternal uncle with sudden death.  [place] for AAA pending.  Encouraged to stop smoking.

## 2018-05-23 ENCOUNTER — Other Ambulatory Visit: Payer: Self-pay | Admitting: Primary Care

## 2018-05-23 ENCOUNTER — Other Ambulatory Visit: Payer: 59

## 2018-05-23 DIAGNOSIS — Z8249 Family history of ischemic heart disease and other diseases of the circulatory system: Secondary | ICD-10-CM

## 2018-05-23 DIAGNOSIS — F17200 Nicotine dependence, unspecified, uncomplicated: Secondary | ICD-10-CM

## 2018-05-31 ENCOUNTER — Ambulatory Visit (INDEPENDENT_AMBULATORY_CARE_PROVIDER_SITE_OTHER): Payer: 59

## 2018-05-31 DIAGNOSIS — Z8249 Family history of ischemic heart disease and other diseases of the circulatory system: Secondary | ICD-10-CM

## 2018-05-31 DIAGNOSIS — F17200 Nicotine dependence, unspecified, uncomplicated: Secondary | ICD-10-CM | POA: Diagnosis not present

## 2018-06-08 ENCOUNTER — Encounter: Payer: Self-pay | Admitting: Primary Care

## 2018-06-08 ENCOUNTER — Ambulatory Visit: Payer: 59 | Admitting: Primary Care

## 2018-06-08 VITALS — BP 146/84 | HR 84 | Temp 98.0°F | Ht 71.0 in | Wt 242.5 lb

## 2018-06-08 DIAGNOSIS — I10 Essential (primary) hypertension: Secondary | ICD-10-CM

## 2018-06-08 DIAGNOSIS — Z8249 Family history of ischemic heart disease and other diseases of the circulatory system: Secondary | ICD-10-CM

## 2018-06-08 DIAGNOSIS — R7303 Prediabetes: Secondary | ICD-10-CM

## 2018-06-08 MED ORDER — LOSARTAN POTASSIUM-HCTZ 50-12.5 MG PO TABS: 1.0000 | ORAL_TABLET | Freq: Every day | ORAL | 0 refills | Status: DC

## 2018-06-08 NOTE — Patient Instructions (Signed)
Stop taking lisinpril 10 mg tablets for blood pressure.  Start taking losartan-hydrochlorothiazide 50-12.5 mg tablets for blood pressure. Take 1 tablet by mouth every morning.  Work on reducing stress levels as discussed.  Work on [employer]. Start exercising when possible.  Schedule a follow up visit in 3 weeks for blood pressure check.   It was a pleasure to see you today!   DASH Eating Plan DASH stands for "Dietary Approaches to Stop Hypertension." The DASH eating plan is a healthy eating plan that has been shown to reduce high blood pressure (hypertension). It may also reduce your risk for type 2 diabetes, heart disease, and stroke. The DASH eating plan may also help with weight loss. What are tips for following this plan? General guidelines  Avoid eating more than 2,300 mg (milligrams) of salt (sodium) a day. If you have hypertension, you may need to reduce your sodium intake to 1,500 mg a day.  Limit alcohol intake to no more than 1 drink a day for nonpregnant women and 2 drinks a day for men. One drink equals 12 oz of beer, 5 oz of wine, or 1 oz of hard liquor.  Work with your health care provider to maintain a healthy body weight or to lose weight. Ask what an ideal weight is for you.  Get at least 30 minutes of exercise that causes your heart to beat faster (aerobic exercise) most days of the week. Activities may include walking, swimming, or biking.  Work with your health care provider or diet and nutrition specialist (dietitian) to adjust your eating plan to your individual calorie needs. Reading food labels  Check food labels for the amount of sodium per serving. Choose foods with less than 5 percent of the Daily Value of sodium. Generally, foods with less than 300 mg of sodium per serving fit into this eating plan.  To find whole grains, look for the word "whole" as the first word in the ingredient list. Shopping  Buy products labeled as "low-sodium" or "no salt  added."  Buy fresh foods. Avoid canned foods and premade or frozen meals. Cooking  Avoid adding salt when cooking. Use salt-free seasonings or herbs instead of table salt or sea salt. Check with your health care provider or pharmacist before using salt substitutes.  Do not fry foods. Cook foods using healthy methods such as baking, boiling, grilling, and broiling instead.  Cook with heart-healthy oils, such as olive, canola, soybean, or sunflower oil. Meal planning   Eat a balanced diet that includes: ? 5 or more servings of fruits and vegetables each day. At each meal, try to fill half of your plate with fruits and vegetables. ? Up to 6-8 servings of whole grains each day. ? Less than 6 oz of lean meat, poultry, or fish each day. A 3-oz serving of meat is about the same size as a deck of cards. One egg equals 1 oz. ? 2 servings of low-fat dairy each day. ? A serving of nuts, seeds, or beans 5 times each week. ? Heart-healthy fats. Healthy fats called Omega-3 fatty acids are found in foods such as flaxseeds and coldwater fish, like sardines, salmon, and mackerel.  Limit how much you eat of the following: ? Canned or prepackaged foods. ? Food that is high in trans fat, such as fried foods. ? Food that is high in saturated fat, such as fatty meat. ? Sweets, desserts, sugary drinks, and other foods with added sugar. ? Full-fat dairy products.  Do not salt foods before eating.  Try to eat at least 2 vegetarian meals each week.  Eat more home-cooked food and less restaurant, buffet, and fast food.  When eating at a restaurant, ask that your food be prepared with less salt or no salt, if possible. What foods are recommended? The items listed may not be a complete list. Talk with your dietitian about what dietary choices are best for you. Grains Whole-grain or whole-wheat bread. Whole-grain or whole-wheat pasta. Brown rice. [place]row. Bulgur. Whole-grain and low-sodium cereals.  Pita bread. Low-fat, low-sodium crackers. Whole-wheat flour tortillas. Vegetables Fresh or frozen vegetables (raw, steamed, roasted, or grilled). Low-sodium or reduced-sodium tomato and vegetable juice. Low-sodium or reduced-sodium tomato sauce and tomato paste. Low-sodium or reduced-sodium canned vegetables. Fruits All fresh, dried, or frozen fruit. Canned fruit in natural juice (without added sugar). Meat and other protein foods Skinless chicken or [place]. Ground chicken or [place]. Pork with fat trimmed off. Fish and seafood. Egg whites. Dried beans, peas, or lentils. Unsalted nuts, nut butters, and seeds. Unsalted canned beans. Lean cuts of beef with fat trimmed off. Low-sodium, lean deli meat. Dairy Low-fat (1%) or fat-free (skim) milk. Fat-free, low-fat, or reduced-fat cheeses. Nonfat, low-sodium ricotta or cottage cheese. Low-fat or nonfat yogurt. Low-fat, low-sodium cheese. Fats and oils Soft margarine without trans fats. Vegetable oil. Low-fat, reduced-fat, or light mayonnaise and salad dressings (reduced-sodium). Canola, safflower, olive, soybean, and sunflower oils. Avocado. Seasoning and other foods Herbs. Spices. Seasoning mixes without salt. Unsalted popcorn and pretzels. Fat-free sweets. What foods are not recommended? The items listed may not be a complete list. Talk with your dietitian about what dietary choices are best for you. Grains Baked goods made with fat, such as croissants, muffins, or some breads. Dry pasta or rice meal packs. Vegetables Creamed or fried vegetables. Vegetables in a cheese sauce. Regular canned vegetables (not low-sodium or reduced-sodium). Regular canned tomato sauce and paste (not low-sodium or reduced-sodium). Regular tomato and vegetable juice (not low-sodium or reduced-sodium). [name]. Olives. Fruits Canned fruit in a light or heavy syrup. Fried fruit. Fruit in cream or butter sauce. Meat and other protein foods Fatty cuts of meat. Ribs. Fried  meat. Berniece Salines. Sausage. Bologna and other processed lunch meats. Salami. Fatback. Hotdogs. Bratwurst. Salted nuts and seeds. Canned beans with added salt. Canned or smoked fish. Whole eggs or egg yolks. Chicken or [place] with skin. Dairy Whole or 2% milk, cream, and half-and-half. Whole or full-fat cream cheese. Whole-fat or sweetened yogurt. Full-fat cheese. Nondairy creamers. Whipped toppings. Processed cheese and cheese spreads. Fats and oils Butter. Stick margarine. Lard. Shortening. Ghee. Bacon fat. Tropical oils, such as coconut, palm kernel, or palm oil. Seasoning and other foods Salted popcorn and pretzels. Onion salt, garlic salt, seasoned salt, table salt, and sea salt. Worcestershire sauce. Tartar sauce. Barbecue sauce. Teriyaki sauce. Soy sauce, including reduced-sodium. Steak sauce. Canned and packaged gravies. Fish sauce. Oyster sauce. Cocktail sauce. Horseradish that you find on the shelf. Ketchup. Mustard. Meat flavorings and tenderizers. Bouillon cubes. Hot sauce and Tabasco sauce. Premade or packaged marinades. Premade or packaged taco seasonings. Relishes. Regular salad dressings. Where to find more information:  National Heart, Lung, and [place]: https://wilson-eaton.com/  American Heart Association: www.heart.org Summary  The DASH eating plan is a healthy eating plan that has been shown to reduce high blood pressure (hypertension). It may also reduce your risk for type 2 diabetes, heart disease, and stroke.  With the DASH eating plan, you should limit salt (sodium)  intake to 2,300 mg a day. If you have hypertension, you may need to reduce your sodium intake to 1,500 mg a day.  When on the DASH eating plan, aim to eat more fresh fruits and vegetables, whole grains, lean proteins, low-fat dairy, and heart-healthy fats.  Work with your health care provider or diet and nutrition specialist (dietitian) to adjust your eating plan to your individual calorie needs. This information is  not intended to replace advice given to you by your health care provider. Make sure you discuss any questions you have with your health care provider. Document Released: 09/08/2011 Document Revised: 09/12/2016 Document Reviewed: 09/12/2016 Elsevier Interactive Patient Education  [employer].

## 2018-06-08 NOTE — Progress Notes (Signed)
Subjective:    Patient ID: Austin Hill, male    DOB: Dec 06, 1965, 52 y.o.   MRN: [id number]  HPI  Austin Hill is a 52 year old male who presents today for follow up of hypertension.  He was last evaluated several weeks ago with elevated blood pressure reading and numerous documented readings during prior visits. He was treated with lisinopril 10 mg daily and asked to return for follow up.  BP Readings from Last 3 Encounters:  06/08/18 (!) 146/84  05/18/18 (!) 144/90  10/26/17 98/74   Since his last visit he's checking his BP at home which is running 140's/90's-100's. He denies chest pain, headaches, dizziness. He endorses a dry mouth and throat since starting lisinopril. He is interested in starting another medication. He attributes his elevated BP due to stress as he has a lot of stress in both at work and at home.    Review of Systems  Eyes: Negative for visual disturbance.  Respiratory: Negative for cough and shortness of breath.   Cardiovascular: Negative for chest pain.  Neurological: Negative for dizziness and headaches.  Psychiatric/Behavioral:       Stress       No past medical history on file.   Social History   Socioeconomic History  . Marital status: Divorced    Spouse name: Not on file  . Number of children: Not on file  . Years of education: Not on file  . Highest education level: Not on file  Occupational History  . Not on file  Social Needs  . Financial resource strain: Not on file  . Food insecurity:    Worry: Not on file    Inability: Not on file  . Transportation needs:    Medical: Not on file    Non-medical: Not on file  Tobacco Use  . Smoking status: Current Every Day Smoker    Packs/day: 1.00    Years: 25.00    Pack years: 25.00    Types: Cigarettes  . Smokeless tobacco: Never Used  Substance and Sexual Activity  . Alcohol use: Yes    Alcohol/week: 0.0 standard drinks    Comment: every couple of days  . Drug use: No  . Sexual  activity: Not on file  Lifestyle  . Physical activity:    Days per week: Not on file    Minutes per session: Not on file  . Stress: Not on file  Relationships  . Social connections:    Talks on phone: Not on file    Gets together: Not on file    Attends religious service: Not on file    Active member of club or organization: Not on file    Attends meetings of clubs or organizations: Not on file    Relationship status: Not on file  . Intimate partner violence:    Fear of current or ex partner: Not on file    Emotionally abused: Not on file    Physically abused: Not on file    Forced sexual activity: Not on file  Other Topics Concern  . Not on file  Social History Narrative   From [place] area.   Single, has girlfriend.   Three children. His son lives locally.   Enjoy camping, golfing, swimming.    Past Surgical History:  Procedure Laterality Date  . COLONOSCOPY W/ BIOPSIES  about 10 years ago   polyps  . COLONOSCOPY WITH PROPOFOL N/A 10/26/2017   Procedure: COLONOSCOPY WITH PROPOFOL;  Surgeon: Jonathon Bellows,  MD;  Location: [facility] ENDOSCOPY;  Service: Gastroenterology;  Laterality: N/A;  . WRIST SURGERY Left 69-53 years old   compound fracture    Family History  Problem Relation Age of Onset  . Aneurysm Father        Abdominal  . Aneurysm Paternal Grandfather        Abdominal     No Known Allergies  Current Outpatient Medications on File Prior to Visit  Medication Sig Dispense Refill  . ibuprofen (ADVIL,MOTRIN) 200 MG tablet Take 200 mg by mouth as needed.    . Multiple Vitamin (MULTIVITAMIN) tablet Take 1 tablet by mouth daily.     No current facility-administered medications on file prior to visit.     BP (!) 146/84   Pulse 84   Temp 98 F (36.7 C) (Oral)   Ht 5\' 11"  (1.803 m)   Wt 242 lb 8 oz (110 kg)   SpO2 97%   BMI 33.82 kg/m    Objective:   Physical Exam  Constitutional: He appears well-nourished.  Neck: Neck supple.  Cardiovascular: Normal rate  and regular rhythm.  Respiratory: Effort normal and breath sounds normal.  Skin: Skin is warm and dry.           Assessment & Plan:

## 2018-06-08 NOTE — Assessment & Plan Note (Signed)
Recent imaging without evidence of AAA. Continue to monitor.

## 2018-06-08 NOTE — Assessment & Plan Note (Signed)
Slight improvement on lisinopril 10 mg which maybe causing a dry mouth/throat. Stop lisinopril. He does need a dose increase. Start losartan-HCTZ 50-12.5 mg. He will continue to monitor BP at home and return in 2-3 weeks for re-evaluation.   BMP next visit.

## 2018-06-08 NOTE — Assessment & Plan Note (Signed)
Recent A1C of 6.1.  Discussed the importance of a healthy diet and regular exercise in order for weight loss, and to reduce the risk of any potential medical problems.

## 2018-06-12 ENCOUNTER — Other Ambulatory Visit: Payer: Self-pay | Admitting: Primary Care

## 2018-06-12 DIAGNOSIS — I10 Essential (primary) hypertension: Secondary | ICD-10-CM

## 2018-06-29 ENCOUNTER — Ambulatory Visit (INDEPENDENT_AMBULATORY_CARE_PROVIDER_SITE_OTHER): Payer: 59 | Admitting: Primary Care

## 2018-06-29 ENCOUNTER — Encounter: Payer: Self-pay | Admitting: Primary Care

## 2018-06-29 VITALS — BP 114/70 | HR 88 | Temp 98.2°F | Ht 71.0 in | Wt 241.0 lb

## 2018-06-29 DIAGNOSIS — R7303 Prediabetes: Secondary | ICD-10-CM

## 2018-06-29 DIAGNOSIS — I10 Essential (primary) hypertension: Secondary | ICD-10-CM | POA: Diagnosis not present

## 2018-06-29 LAB — BASIC METABOLIC PANEL
BUN: 16 mg/dL (ref 6–23)
CALCIUM: 9.2 mg/dL (ref 8.4–10.5)
CO2: 24 mEq/L (ref 19–32)
CREATININE: 1.26 mg/dL (ref 0.40–1.50)
Chloride: 101 mEq/L (ref 96–112)
GFR: 63.48 mL/min (ref >60.00)
GLUCOSE: 116 mg/dL — AB (ref 70–99)
Potassium: 3.6 mEq/L (ref 3.5–5.1)
Sodium: 136 mEq/L (ref 135–145)

## 2018-06-29 MED ORDER — LOSARTAN POTASSIUM-HCTZ 50-12.5 MG PO TABS: ORAL_TABLET | ORAL | 3 refills | Status: DC

## 2018-06-29 NOTE — Patient Instructions (Addendum)
Stop by the lab prior to leaving today. I will notify you of your results once received.   Continue losartan-hydrochlorothiazide medication for blood pressure. I sent refills to the pharmacy.  I'd like to repeat your A1C in 5 months, please schedule a lab only appointment for that time.  It was a pleasure to see you today!

## 2018-06-29 NOTE — Progress Notes (Signed)
Subjective:    Patient ID: Austin Hill, male    DOB: Feb 15, 1966, 52 y.o.   MRN: [id number]  HPI  Austin Hill is a 52 year old male who presents today for follow up of hypertension.  He was last evaluated in early September with continued elevated blood pressure readings on lisinopril 10 mg. He was also experiencing a dry mouth/throat so he was switched to losartan 50 mg and HCTZ 12.5 mg was added.  Since his last visit his BP has improved. He denies chest pain, dizziness, cough, sore/dry throat. He's check his BP at home which is ranging 130-140/70-80's.   BP Readings from Last 3 Encounters:  06/29/18 114/70  06/08/18 (!) 146/84  05/18/18 (!) 144/90     Review of Systems  Respiratory: Negative for cough and shortness of breath.   Cardiovascular: Negative for chest pain.  Neurological: Negative for dizziness and headaches.       No past medical history on file.   Social History   Socioeconomic History  . Marital status: Divorced    Spouse name: Not on file  . Number of children: Not on file  . Years of education: Not on file  . Highest education level: Not on file  Occupational History  . Not on file  Social Needs  . Financial resource strain: Not on file  . Food insecurity:    Worry: Not on file    Inability: Not on file  . Transportation needs:    Medical: Not on file    Non-medical: Not on file  Tobacco Use  . Smoking status: Current Every Day Smoker    Packs/day: 1.00    Years: 25.00    Pack years: 25.00    Types: Cigarettes  . Smokeless tobacco: Never Used  Substance and Sexual Activity  . Alcohol use: Yes    Alcohol/week: 0.0 standard drinks    Comment: every couple of days  . Drug use: No  . Sexual activity: Not on file  Lifestyle  . Physical activity:    Days per week: Not on file    Minutes per session: Not on file  . Stress: Not on file  Relationships  . Social connections:    Talks on phone: Not on file    Gets together: Not on  file    Attends religious service: Not on file    Active member of club or organization: Not on file    Attends meetings of clubs or organizations: Not on file    Relationship status: Not on file  . Intimate partner violence:    Fear of current or ex partner: Not on file    Emotionally abused: Not on file    Physically abused: Not on file    Forced sexual activity: Not on file  Other Topics Concern  . Not on file  Social History Narrative   From [place] area.   Single, has girlfriend.   Three children. His son lives locally.   Enjoy camping, golfing, swimming.    Past Surgical History:  Procedure Laterality Date  . COLONOSCOPY W/ BIOPSIES  about 10 years ago   polyps  . COLONOSCOPY WITH PROPOFOL N/A 10/26/2017   Procedure: COLONOSCOPY WITH PROPOFOL;  Surgeon: Jonathon Bellows, MD;  Location: [facility] ENDOSCOPY;  Service: Gastroenterology;  Laterality: N/A;  . WRIST SURGERY Left 76-45 years old   compound fracture    Family History  Problem Relation Age of Onset  . Aneurysm Father  Abdominal  . Aneurysm Paternal Grandfather        Abdominal     No Known Allergies  Current Outpatient Medications on File Prior to Visit  Medication Sig Dispense Refill  . ibuprofen (ADVIL,MOTRIN) 200 MG tablet Take 200 mg by mouth as needed.    Marland Kitchen losartan-hydrochlorothiazide (HYZAAR) 50-12.5 MG tablet Take 1 tablet by mouth daily. 30 tablet 0  . Multiple Vitamin (MULTIVITAMIN) tablet Take 1 tablet by mouth daily.     No current facility-administered medications on file prior to visit.     BP 114/70   Pulse 88   Temp 98.2 F (36.8 C) (Oral)   Ht 5\' 11"  (1.803 m)   Wt 241 lb (109.3 kg)   SpO2 96%   BMI 33.61 kg/m    Objective:   Physical Exam  Constitutional: He appears well-nourished.  Neck: Neck supple.  Cardiovascular: Normal rate and regular rhythm.  Respiratory: Effort normal and breath sounds normal.  Skin: Skin is warm and dry.           Assessment & Plan:

## 2018-06-29 NOTE — Assessment & Plan Note (Signed)
Improved on losartan-HCTZ. Home readings are higher, question accuracy of his BP cuff as manual reading today has significantly improved.  Continue losartan-HCTZ daily. BMP pending. Continue to monitor.

## 2018-07-02 ENCOUNTER — Encounter: Payer: Self-pay | Admitting: *Deleted

## 2018-07-03 ENCOUNTER — Other Ambulatory Visit: Payer: Self-pay | Admitting: Primary Care

## 2018-07-03 DIAGNOSIS — I10 Essential (primary) hypertension: Secondary | ICD-10-CM

## 2018-09-14 DIAGNOSIS — J111 Influenza due to unidentified influenza virus with other respiratory manifestations: Secondary | ICD-10-CM | POA: Diagnosis not present

## 2018-09-14 DIAGNOSIS — R509 Fever, unspecified: Secondary | ICD-10-CM | POA: Diagnosis not present

## 2018-09-14 DIAGNOSIS — I10 Essential (primary) hypertension: Secondary | ICD-10-CM | POA: Diagnosis not present

## 2018-11-12 ENCOUNTER — Telehealth: Payer: Self-pay | Admitting: Primary Care

## 2018-11-12 NOTE — Telephone Encounter (Signed)
error 

## 2018-11-30 ENCOUNTER — Other Ambulatory Visit (INDEPENDENT_AMBULATORY_CARE_PROVIDER_SITE_OTHER): Payer: 59

## 2018-11-30 DIAGNOSIS — R7303 Prediabetes: Secondary | ICD-10-CM | POA: Diagnosis not present

## 2018-11-30 LAB — POCT GLYCOSYLATED HEMOGLOBIN (HGB A1C): Hemoglobin A1C: 6.1 % — AB (ref 4.0–5.6)

## 2019-02-06 ENCOUNTER — Ambulatory Visit (INDEPENDENT_AMBULATORY_CARE_PROVIDER_SITE_OTHER): Payer: 59 | Admitting: Primary Care

## 2019-02-06 ENCOUNTER — Encounter: Payer: Self-pay | Admitting: Primary Care

## 2019-02-06 VITALS — BP 133/85

## 2019-02-06 DIAGNOSIS — N529 Male erectile dysfunction, unspecified: Secondary | ICD-10-CM | POA: Diagnosis not present

## 2019-02-06 MED ORDER — SILDENAFIL CITRATE 20 MG PO TABS: ORAL_TABLET | ORAL | 0 refills | Status: DC

## 2019-02-06 NOTE — Patient Instructions (Signed)
Start sildenafil 20 mg tablets for erectile dysfunction. You may take 2-5 tablets 30 minutes prior to intercourse. Start with 2 tablets initially.   Work on weight loss through healthy diet and exercise. Work on quitting smoking.  Keep an eye on your blood pressure and notify me if you see readings at or above 135/90.  It was a pleasure to see you today! Allie Bossier, NP-C

## 2019-02-06 NOTE — Progress Notes (Signed)
Subjective:    Patient ID: Austin Hill, male    DOB: [birth year], 53 y.o.   MRN: [id number]  HPI  Virtual Visit via Video Note  I connected with Austin Hill on 02/06/19 at 12:00 PM EDT by a video enabled telemedicine application and verified that I am speaking with the correct person using two identifiers.  Location: Patient: Home Provider: Office   I discussed the limitations of evaluation and management by telemedicine and the availability of in person appointments. The patient expressed understanding and agreed to proceed.  History of Present Illness:  Austin Hill is a 53 year old male with a history of hypertension, tobacco abuse, prediabetes who presents today to discuss erectile dysfunction.  He endorses difficulty maintaining an erection, no problems with obtaining an erection. His erection will last for about 10 minutes total. He's never tired any medication in the past for treatment. He denies chest pain, dizziness, painful erections.    Observations/Objective:  Alert and oriented. Appears well, not sickly. No distress. Speaking in complete sentences.   Assessment and Plan:  See problem based charting.   Follow Up Instructions:  Start sildenafil 20 mg tablets for erectile dysfunction. You may take 2-5 tablets 30 minutes prior to intercourse. Start with 2 tablets initially.   Work on weight loss through healthy diet and exercise. Work on quitting smoking.  Keep an eye on your blood pressure and notify me if you see readings at or above 135/90.  It was a pleasure to see you today! Austin Bossier, NP-C    I discussed the assessment and treatment plan with the patient. The patient was provided an opportunity to ask questions and all were answered. The patient agreed with the plan and demonstrated an understanding of the instructions.   The patient was advised to call back or seek an in-person evaluation if the symptoms worsen or if the condition  fails to improve as anticipated.     Austin Koch, NP    Review of Systems  Respiratory: Negative for shortness of breath.   Cardiovascular: Negative for chest pain.  Genitourinary:       Erectile dysfunction  Neurological: Negative for dizziness and headaches.       No past medical history on file.   Social History   Socioeconomic History  . Marital status: Divorced    Spouse name: Not on file  . Number of children: Not on file  . Years of education: Not on file  . Highest education level: Not on file  Occupational History  . Not on file  Social Needs  . Financial resource strain: Not on file  . Food insecurity:    Worry: Not on file    Inability: Not on file  . Transportation needs:    Medical: Not on file    Non-medical: Not on file  Tobacco Use  . Smoking status: Current Every Day Smoker    Packs/day: 1.00    Years: 25.00    Pack years: 25.00    Types: Cigarettes  . Smokeless tobacco: Never Used  Substance and Sexual Activity  . Alcohol use: Yes    Alcohol/week: 0.0 standard drinks    Comment: every couple of days  . Drug use: No  . Sexual activity: Not on file  Lifestyle  . Physical activity:    Days per week: Not on file    Minutes per session: Not on file  . Stress: Not on file  Relationships  . Social  connections:    Talks on phone: Not on file    Gets together: Not on file    Attends religious service: Not on file    Active member of club or organization: Not on file    Attends meetings of clubs or organizations: Not on file    Relationship status: Not on file  . Intimate partner violence:    Fear of current or ex partner: Not on file    Emotionally abused: Not on file    Physically abused: Not on file    Forced sexual activity: Not on file  Other Topics Concern  . Not on file  Social History Narrative   From [place] area.   Single, has girlfriend.   Three children. His son lives locally.   Enjoy camping, golfing, swimming.     Past Surgical History:  Procedure Laterality Date  . COLONOSCOPY W/ BIOPSIES  about 10 years ago   polyps  . COLONOSCOPY WITH PROPOFOL N/A 10/26/2017   Procedure: COLONOSCOPY WITH PROPOFOL;  Surgeon: Austin Bellows, MD;  Location: [facility] ENDOSCOPY;  Service: Gastroenterology;  Laterality: N/A;  . WRIST SURGERY Left 50-61 years old   compound fracture    Family History  Problem Relation Age of Onset  . Aneurysm Father        Abdominal  . Aneurysm Paternal Grandfather        Abdominal     No Known Allergies  Current Outpatient Medications on File Prior to Visit  Medication Sig Dispense Refill  . ibuprofen (ADVIL,MOTRIN) 200 MG tablet Take 200 mg by mouth as needed.    Marland Kitchen losartan-hydrochlorothiazide (HYZAAR) 50-12.5 MG tablet Take 1 tablet by mouth once daily for blood pressure. 90 tablet 3  . Multiple Vitamin (MULTIVITAMIN) tablet Take 1 tablet by mouth daily.     No current facility-administered medications on file prior to visit.     BP 133/85    Objective:   Physical Exam  Constitutional: He is oriented to person, place, and time. He appears well-nourished.  Respiratory: Effort normal. No respiratory distress.  Neurological: He is alert and oriented to person, place, and time.  Psychiatric: He has a normal mood and affect.           Assessment & Plan:

## 2019-02-06 NOTE — Assessment & Plan Note (Signed)
Discussed potential contributing factors including obesity, hypertension, tobacco abuse. Strongly advised he work to improve.   Will trial low dose sildenafil 2-5 tablets PRN prior to intercourse. Rx sent to pharmacy.  Follow up PRN.

## 2019-04-01 ENCOUNTER — Other Ambulatory Visit: Payer: Self-pay | Admitting: Primary Care

## 2019-04-01 DIAGNOSIS — N529 Male erectile dysfunction, unspecified: Secondary | ICD-10-CM

## 2019-06-23 ENCOUNTER — Other Ambulatory Visit: Payer: Self-pay | Admitting: Primary Care

## 2019-06-23 DIAGNOSIS — I10 Essential (primary) hypertension: Secondary | ICD-10-CM

## 2019-07-27 ENCOUNTER — Other Ambulatory Visit: Payer: Self-pay | Admitting: Primary Care

## 2019-07-27 DIAGNOSIS — N529 Male erectile dysfunction, unspecified: Secondary | ICD-10-CM

## 2019-08-15 ENCOUNTER — Other Ambulatory Visit: Payer: Self-pay | Admitting: Primary Care

## 2019-08-15 DIAGNOSIS — I10 Essential (primary) hypertension: Secondary | ICD-10-CM

## 2019-11-14 ENCOUNTER — Other Ambulatory Visit: Payer: Self-pay | Admitting: Primary Care

## 2019-11-14 DIAGNOSIS — N529 Male erectile dysfunction, unspecified: Secondary | ICD-10-CM

## 2019-12-01 ENCOUNTER — Other Ambulatory Visit: Payer: Self-pay | Admitting: Primary Care

## 2019-12-01 DIAGNOSIS — I10 Essential (primary) hypertension: Secondary | ICD-10-CM

## 2020-02-04 ENCOUNTER — Ambulatory Visit: Payer: 59 | Admitting: Primary Care

## 2020-02-04 ENCOUNTER — Other Ambulatory Visit: Payer: Self-pay

## 2020-02-04 ENCOUNTER — Encounter: Payer: Self-pay | Admitting: Primary Care

## 2020-02-04 ENCOUNTER — Ambulatory Visit (INDEPENDENT_AMBULATORY_CARE_PROVIDER_SITE_OTHER)
Admission: RE | Admit: 2020-02-04 | Discharge: 2020-02-04 | Disposition: A | Discharge status: Home / Self Care | Payer: 59 | Source: Ambulatory Visit | Attending: Primary Care | Admitting: Primary Care

## 2020-02-04 VITALS — BP 132/94 | HR 90 | Temp 96.2°F | Ht 71.0 in | Wt 233.5 lb

## 2020-02-04 DIAGNOSIS — R7303 Prediabetes: Secondary | ICD-10-CM | POA: Diagnosis not present

## 2020-02-04 DIAGNOSIS — I10 Essential (primary) hypertension: Secondary | ICD-10-CM

## 2020-02-04 DIAGNOSIS — M545 Low back pain, unspecified: Secondary | ICD-10-CM

## 2020-02-04 DIAGNOSIS — N529 Male erectile dysfunction, unspecified: Secondary | ICD-10-CM | POA: Diagnosis not present

## 2020-02-04 HISTORY — DX: Low back pain, unspecified: M54.50

## 2020-02-04 LAB — CBC
HCT: 47.9 % (ref 39.0–52.0)
Hemoglobin: 16.4 g/dL (ref 13.0–17.0)
MCHC: 34.3 g/dL (ref 30.0–36.0)
MCV: 97.6 fl (ref 78.0–100.0)
Platelets: 239 10*3/uL (ref 150.0–400.0)
RBC: 4.9 Mil/uL (ref 4.22–5.81)
RDW: 13.2 % (ref 11.5–15.5)
WBC: 6.2 10*3/uL (ref 4.0–10.5)

## 2020-02-04 LAB — COMPREHENSIVE METABOLIC PANEL
ALT: 18 U/L (ref 0–53)
AST: 18 U/L (ref 0–37)
Albumin: 4.4 g/dL (ref 3.5–5.2)
Alkaline Phosphatase: 49 U/L (ref 39–117)
BUN: 11 mg/dL (ref 6–23)
CO2: 32 mEq/L (ref 19–32)
Calcium: 9.9 mg/dL (ref 8.4–10.5)
Chloride: 98 mEq/L (ref 96–112)
Creatinine, Ser: 1.08 mg/dL (ref 0.40–1.50)
GFR: 70.93 mL/min (ref >60.00)
Glucose, Bld: 103 mg/dL — ABNORMAL HIGH (ref 70–99)
Potassium: 3.9 mEq/L (ref 3.5–5.1)
Sodium: 133 mEq/L — ABNORMAL LOW (ref 135–145)
Total Bilirubin: 0.6 mg/dL (ref 0.2–1.2)
Total Protein: 6.7 g/dL (ref 6.0–8.3)

## 2020-02-04 LAB — HEMOGLOBIN A1C: Hgb A1c MFr Bld: 5.9 % (ref 4.6–6.5)

## 2020-02-04 LAB — LIPID PANEL
Cholesterol: 152 mg/dL (ref 0–200)
HDL: 58.8 mg/dL (ref >39.00)
LDL Cholesterol: 83 mg/dL (ref 0–99)
NonHDL: 93.47
Total CHOL/HDL Ratio: 3
Triglycerides: 52 mg/dL (ref 0.0–149.0)
VLDL: 10.4 mg/dL (ref 0.0–40.0)

## 2020-02-04 MED ORDER — SILDENAFIL CITRATE 20 MG PO TABS: ORAL_TABLET | ORAL | 0 refills | Status: DC

## 2020-02-04 MED ORDER — LOSARTAN POTASSIUM-HCTZ 100-12.5 MG PO TABS: 1.0000 | ORAL_TABLET | Freq: Every day | ORAL | 0 refills | Status: DC

## 2020-02-04 MED ORDER — NAPROXEN 500 MG PO TABS: 500.0000 mg | ORAL_TABLET | Freq: Two times a day (BID) | ORAL | 0 refills | Status: DC

## 2020-02-04 NOTE — Assessment & Plan Note (Signed)
Above goal in the office today, also with home readings. Increase losartan-HCTZ to 100-12.5 mg daily. CMP pending. Will call for BP readings in 2 weeks.

## 2020-02-04 NOTE — Patient Instructions (Signed)
Stop by the lab and xray prior to leaving today. I will notify you of your results once received.   You may take the naproxen 500 mg tablets twice daily with food for back pain. Do this for about one week. Make sure to stretch your lower back and apply ice/heat to help reduce pain.   We've increased the dose of your blood pressure medication from losartan-hydrochlorothiazide 50-12.5 mg to losartan-hydrochlorothiazide 100-12.5 mg. I sent a new prescription to your pharmacy.  Monitor your blood pressure and update me with readings in 2 weeks.  It was a pleasure to see you today!

## 2020-02-04 NOTE — Progress Notes (Signed)
Subjective:    Patient ID: Austin Hill, male    DOB: [birth year], 54 y.o.   MRN: [id number]  HPI  This visit occurred during the SARS-CoV-2 public health emergency.  Safety protocols were in place, including screening questions prior to the visit, additional usage of staff PPE, and extensive cleaning of exam room while observing appropriate contact time as indicated for disinfecting solutions.   Austin Hill is a 54 year old male with a history of hypertension, tobacco abuse, prediabetes, erectile dysfunction who presents today with a chief complaint of back pain and elevated blood pressure readings. He is also needing medication refills.   He is checking his BP at home for the last several days which is running about 140's-90's. He's felt tired with headaches over the last 2 weeks.   His back pain is located to the right lower back which began about two weeks ago, intermittent. This began when waking one morning. He's taken Aleve with temporary improvement. He describes his pain as sharp, also notices pain to right lower abdomen which is separate.   He denies fevers, diaphoresis, diarrhea, nausea/vomiting, bloody stools, radiation of pain down his lower extremities, urinary frequency, difficulty urinating, hematuria, abdominal tenderness.   BP Readings from Last 3 Encounters:  02/04/20 (!) 132/94  02/06/19 133/85  06/29/18 114/70     Review of Systems  Constitutional: Positive for fatigue.  Eyes: Negative for visual disturbance.  Respiratory: Negative for shortness of breath.   Cardiovascular: Negative for chest pain.  Gastrointestinal: Positive for abdominal pain. Negative for blood in stool, diarrhea, nausea and vomiting.  Neurological: Positive for headaches.       No past medical history on file.   Social History   Socioeconomic History  . Marital status: Divorced    Spouse name: Not on file  . Number of children: Not on file  . Years of education: Not on file  .  Highest education level: Not on file  Occupational History  . Not on file  Tobacco Use  . Smoking status: Current Every Day Smoker    Packs/day: 1.00    Years: 25.00    Pack years: 25.00    Types: Cigarettes  . Smokeless tobacco: Never Used  Substance and Sexual Activity  . Alcohol use: Yes    Alcohol/week: 0.0 standard drinks    Comment: every couple of days  . Drug use: No  . Sexual activity: Not on file  Other Topics Concern  . Not on file  Social History Narrative   From [place] area.   Single, has girlfriend.   Three children. His son lives locally.   Enjoy camping, golfing, swimming.   Social Determinants of Health   Financial Resource Strain:   . Difficulty of Paying Living Expenses:   Food Insecurity:   . Worried About Charity fundraiser in the Last Year:   . Arboriculturist in the Last Year:   Transportation Needs:   . Film/video editor (Medical):   Marland Kitchen Lack of Transportation (Non-Medical):   Physical Activity:   . Days of Exercise per Week:   . Minutes of Exercise per Session:   Stress:   . Feeling of Stress :   Social Connections:   . Frequency of Communication with Friends and Family:   . Frequency of Social Gatherings with Friends and Family:   . Attends Religious Services:   . Active Member of Clubs or Organizations:   . Attends Archivist Meetings:   .  Marital Status:   Intimate Partner Violence:   . Fear of Current or Ex-Partner:   . Emotionally Abused:   Marland Kitchen Physically Abused:   . Sexually Abused:     Past Surgical History:  Procedure Laterality Date  . COLONOSCOPY W/ BIOPSIES  about 10 years ago   polyps  . COLONOSCOPY WITH PROPOFOL N/A 10/26/2017   Procedure: COLONOSCOPY WITH PROPOFOL;  Surgeon: Jonathon Bellows, MD;  Location: [facility] ENDOSCOPY;  Service: Gastroenterology;  Laterality: N/A;  . WRIST SURGERY Left 73-77 years old   compound fracture    Family History  Problem Relation Age of Onset  . Aneurysm Father         Abdominal  . Aneurysm Paternal Grandfather        Abdominal     No Known Allergies  Current Outpatient Medications on File Prior to Visit  Medication Sig Dispense Refill  . ibuprofen (ADVIL,MOTRIN) 200 MG tablet Take 200 mg by mouth as needed.    . Multiple Vitamin (MULTIVITAMIN) tablet Take 1 tablet by mouth daily.     No current facility-administered medications on file prior to visit.    BP (!) 132/94   Pulse 90   Temp (!) 96.2 F (35.7 C) (Temporal)   Ht 5\' 11"  (1.803 m)   Wt 233 lb 8 oz (105.9 kg)   SpO2 98%   BMI 32.57 kg/m    Objective:   Physical Exam  Constitutional: He appears well-nourished.  Cardiovascular: Normal rate and regular rhythm.  Respiratory: Effort normal and breath sounds normal.  GI: Soft. Bowel sounds are normal. There is no abdominal tenderness.  Musculoskeletal:     Cervical back: Neck supple.  Skin: Skin is warm and dry.  Psychiatric: He has a normal mood and affect.           Assessment & Plan:

## 2020-02-04 NOTE — Assessment & Plan Note (Signed)
Unprovoked, acute for 2 weeks. Unclear if right lower abdominal pain is related.   He doesn't seem to exhibit signs of appendicitis, renal stones. No tenderness on exam or other abdominal symptoms.   Check plain films of lumbar spine today. No alarm signs. Rx for naproxen course sent to pharmacy. Discussed stretching/heat/ice.   Consider PT if no improvement. He will update.

## 2020-02-04 NOTE — Assessment & Plan Note (Signed)
Last A1C of 6.1 over one year ago, repeat A1c pending.

## 2020-02-04 NOTE — Assessment & Plan Note (Signed)
Refill provided for sildenafil.

## 2020-02-18 ENCOUNTER — Telehealth: Payer: Self-pay | Admitting: Primary Care

## 2020-02-18 NOTE — Telephone Encounter (Signed)
-----   Message from Pleas Koch, NP sent at 02/04/2020  9:40 AM EDT ----- Regarding: BP readings Please call patient to ask for blood pressure readings since we increased his losartan-HCTZ to 100-12.5mg .

## 2020-02-18 NOTE — Telephone Encounter (Signed)
Noted  

## 2020-02-18 NOTE — Telephone Encounter (Signed)
Spoken to patient and stated that he believe his blood pressure cuff is broken. He kept getting high readings and he tried it on his girlfriend who BP is usually really low. He stated that he plan on getting a new cuff and let us know.

## 2020-02-24 ENCOUNTER — Telehealth: Payer: Self-pay | Admitting: Primary Care

## 2020-02-24 NOTE — Telephone Encounter (Signed)
Please have him evaluated in the office, I'll take a look and see what we can do here.

## 2020-02-24 NOTE — Telephone Encounter (Signed)
Pt unable to be seen by Dermatology until August or later.  Pt is needing the wart removed, sore, raised -- right index finger.   He was advised that he may need to be seen by hand specialist for have it surgically removed and stitches seeing that it is right on the knuckle.   Please advise Anda Kraft if anything can be done in office, thanks.

## 2020-02-25 NOTE — Telephone Encounter (Signed)
I called and scheduled patient to be evaluated. He needed early morning on Friday.

## 2020-02-26 ENCOUNTER — Other Ambulatory Visit: Payer: Self-pay | Admitting: Primary Care

## 2020-02-26 DIAGNOSIS — I10 Essential (primary) hypertension: Secondary | ICD-10-CM

## 2020-02-28 ENCOUNTER — Encounter: Payer: Self-pay | Admitting: Primary Care

## 2020-02-28 ENCOUNTER — Ambulatory Visit (INDEPENDENT_AMBULATORY_CARE_PROVIDER_SITE_OTHER): Payer: 59 | Admitting: Primary Care

## 2020-02-28 ENCOUNTER — Other Ambulatory Visit: Payer: Self-pay

## 2020-02-28 VITALS — BP 142/86 | HR 90 | Temp 96.5°F | Ht 71.0 in | Wt 233.2 lb

## 2020-02-28 DIAGNOSIS — L858 Other specified epidermal thickening: Secondary | ICD-10-CM | POA: Insufficient documentation

## 2020-02-28 NOTE — Progress Notes (Signed)
Subjective:    Patient ID: Austin Hill, male    DOB: 04-15-1966, 54 y.o.   MRN: [id number]  HPI  This visit occurred during the SARS-CoV-2 public health emergency.  Safety protocols were in place, including screening questions prior to the visit, additional usage of staff PPE, and extensive cleaning of exam room while observing appropriate contact time as indicated for disinfecting solutions.   Austin Hill is a 54 year old male with a history of tobacco abuse, hypertension, prediabetes who presents today with a chief complaint of skin mass.  The mass is located at the surface of the right second digit at the PIP joint. He denies pain except when hitting the mass on surfaces or objects. He denies erythema, swelling.   He's tried OTC liquid nitrogen with some improvement.   BP Readings from Last 3 Encounters:  02/28/20 (!) 142/86  02/04/20 (!) 132/94  02/06/19 133/85     Review of Systems  Musculoskeletal: Negative for arthralgias and joint swelling.  Skin: Negative for color change and wound.       Cutaneous horn       No past medical history on file.   Social History   Socioeconomic History  . Marital status: Divorced    Spouse name: Not on file  . Number of children: Not on file  . Years of education: Not on file  . Highest education level: Not on file  Occupational History  . Not on file  Tobacco Use  . Smoking status: Current Every Day Smoker    Packs/day: 1.00    Years: 25.00    Pack years: 25.00    Types: Cigarettes  . Smokeless tobacco: Never Used  Substance and Sexual Activity  . Alcohol use: Yes    Alcohol/week: 0.0 standard drinks    Comment: every couple of days  . Drug use: No  . Sexual activity: Not on file  Other Topics Concern  . Not on file  Social History Narrative   From [place] area.   Single, has girlfriend.   Three children. His son lives locally.   Enjoy camping, golfing, swimming.   Social Determinants of Health    Financial Resource Strain:   . Difficulty of Paying Living Expenses:   Food Insecurity:   . Worried About Charity fundraiser in the Last Year:   . Arboriculturist in the Last Year:   Transportation Needs:   . Film/video editor (Medical):   Marland Kitchen Lack of Transportation (Non-Medical):   Physical Activity:   . Days of Exercise per Week:   . Minutes of Exercise per Session:   Stress:   . Feeling of Stress :   Social Connections:   . Frequency of Communication with Friends and Family:   . Frequency of Social Gatherings with Friends and Family:   . Attends Religious Services:   . Active Member of Clubs or Organizations:   . Attends Archivist Meetings:   Marland Kitchen Marital Status:   Intimate Partner Violence:   . Fear of Current or Ex-Partner:   . Emotionally Abused:   Marland Kitchen Physically Abused:   . Sexually Abused:     Past Surgical History:  Procedure Laterality Date  . COLONOSCOPY W/ BIOPSIES  about 10 years ago   polyps  . COLONOSCOPY WITH PROPOFOL N/A 10/26/2017   Procedure: COLONOSCOPY WITH PROPOFOL;  Surgeon: Jonathon Bellows, MD;  Location: [facility] ENDOSCOPY;  Service: Gastroenterology;  Laterality: N/A;  . WRIST SURGERY Left  10-42 years old   compound fracture    Family History  Problem Relation Age of Onset  . Aneurysm Father        Abdominal  . Aneurysm Paternal Grandfather        Abdominal     No Known Allergies  Current Outpatient Medications on File Prior to Visit  Medication Sig Dispense Refill  . ibuprofen (ADVIL,MOTRIN) 200 MG tablet Take 200 mg by mouth as needed.    Marland Kitchen losartan-hydrochlorothiazide (HYZAAR) 100-12.5 MG tablet Take 1 tablet by mouth daily. For blood pressure. 90 tablet 0  . Multiple Vitamin (MULTIVITAMIN) tablet Take 1 tablet by mouth daily.    . naproxen (NAPROSYN) 500 MG tablet Take 1 tablet (500 mg total) by mouth 2 (two) times daily with a meal. For pain. 14 tablet 0  . sildenafil (REVATIO) 20 MG tablet Take 2-5 tablets by mouth 60 minutes  prior to intercourse as needed. 50 tablet 0   No current facility-administered medications on file prior to visit.    BP (!) 142/86   Pulse 90   Temp (!) 96.5 F (35.8 C) (Temporal)   Ht 5\' 11"  (1.803 m)   Wt 233 lb 4 oz (105.8 kg)   SpO2 98%   BMI 32.53 kg/m    Objective:   Physical Exam  Constitutional: He appears well-nourished.  Skin: Skin is warm and dry. No erythema.  Cutaneous horn imbedded into skin of right second digit at PIP joint. No obvious joint involvement. Horn extends vertically 3 mm.           Assessment & Plan:

## 2020-02-28 NOTE — Assessment & Plan Note (Signed)
Chronic for months, little improvement with OTC liquid nitrogen. Doesn't appear infectious or to involve the joint.   Cryotherapy attempted today, patient consented and tolerated well. Referral placed to dermatology. We discussed that he could return to our office in 1-2 weeks for repeat cryotherapy.

## 2020-02-28 NOTE — Patient Instructions (Signed)
You will be contacted regarding your referral to Dermatology.  Please let us know if you have not been contacted within two weeks.   You may return in 1-2 weeks if you'd like to try another round of freezing.  It was a pleasure to see you today!

## 2020-03-25 ENCOUNTER — Other Ambulatory Visit: Payer: Self-pay | Admitting: Primary Care

## 2020-03-25 DIAGNOSIS — N529 Male erectile dysfunction, unspecified: Secondary | ICD-10-CM

## 2020-04-27 ENCOUNTER — Other Ambulatory Visit: Payer: Self-pay | Admitting: Primary Care

## 2020-04-27 DIAGNOSIS — I10 Essential (primary) hypertension: Secondary | ICD-10-CM

## 2020-04-28 NOTE — Telephone Encounter (Signed)
Last prescribed on 02/04/2020 Last OV (acute) with Allie Bossier on  02/04/2020 No future OV scheduled

## 2020-04-28 NOTE — Telephone Encounter (Signed)
Patient is overdue for CPE/general follow up. Please schedule. Let him know that I can only provide 30 days worth of his medication until he's seen.

## 2020-05-12 NOTE — Telephone Encounter (Signed)
Called patient and scheduled for follow up 06/01/2020.

## 2020-05-19 ENCOUNTER — Other Ambulatory Visit: Payer: Self-pay | Admitting: Primary Care

## 2020-05-19 DIAGNOSIS — N529 Male erectile dysfunction, unspecified: Secondary | ICD-10-CM

## 2020-05-21 ENCOUNTER — Other Ambulatory Visit: Payer: Self-pay | Admitting: Primary Care

## 2020-05-21 DIAGNOSIS — I10 Essential (primary) hypertension: Secondary | ICD-10-CM

## 2020-06-01 ENCOUNTER — Ambulatory Visit: Payer: 59 | Admitting: Primary Care

## 2020-06-01 DIAGNOSIS — Z0289 Encounter for other administrative examinations: Secondary | ICD-10-CM

## 2020-06-22 ENCOUNTER — Other Ambulatory Visit: Payer: Self-pay | Admitting: Primary Care

## 2020-06-22 DIAGNOSIS — I10 Essential (primary) hypertension: Secondary | ICD-10-CM

## 2020-07-01 ENCOUNTER — Other Ambulatory Visit: Payer: Self-pay | Admitting: Primary Care

## 2020-07-01 DIAGNOSIS — N529 Male erectile dysfunction, unspecified: Secondary | ICD-10-CM

## 2020-07-03 NOTE — Telephone Encounter (Signed)
LAST APPOINTMENT DATE: 02/06/2019 last app for this issue last seen by you on 02/28/2020    NEXT APPOINTMENT DATE: Visit date not found    LAST REFILL:05/19/2020  QTY: #6 no rf

## 2020-07-03 NOTE — Telephone Encounter (Signed)
Requested Prescriptions   Signed Prescriptions Disp Refills  . sildenafil (REVATIO) 20 MG tablet 6 tablet 0    Sig: TAKE 2-5 TABLETS BY MOUTH 60 MINUTES PRIOR TO INTERCOURSE AS NEEDED.    Authorizing Provider: Pleas Koch   Refill(s) sent to pharmacy.

## 2020-08-12 ENCOUNTER — Other Ambulatory Visit: Payer: Self-pay | Admitting: Primary Care

## 2020-08-12 DIAGNOSIS — I10 Essential (primary) hypertension: Secondary | ICD-10-CM

## 2020-08-12 DIAGNOSIS — N529 Male erectile dysfunction, unspecified: Secondary | ICD-10-CM

## 2020-09-04 ENCOUNTER — Telehealth (INDEPENDENT_AMBULATORY_CARE_PROVIDER_SITE_OTHER): Payer: 59 | Admitting: Primary Care

## 2020-09-04 ENCOUNTER — Encounter: Payer: Self-pay | Admitting: Primary Care

## 2020-09-04 ENCOUNTER — Other Ambulatory Visit: Payer: Self-pay

## 2020-09-04 VITALS — Ht 71.0 in | Wt 225.0 lb

## 2020-09-04 DIAGNOSIS — N529 Male erectile dysfunction, unspecified: Secondary | ICD-10-CM | POA: Diagnosis not present

## 2020-09-04 DIAGNOSIS — I10 Essential (primary) hypertension: Secondary | ICD-10-CM

## 2020-09-04 DIAGNOSIS — J3089 Other allergic rhinitis: Secondary | ICD-10-CM | POA: Diagnosis not present

## 2020-09-04 MED ORDER — FLUTICASONE PROPIONATE 50 MCG/ACT NA SUSP: 1.0000 | Freq: Two times a day (BID) | NASAL | 0 refills | Status: DC

## 2020-09-04 MED ORDER — BENZONATATE 200 MG PO CAPS: 200.0000 mg | ORAL_CAPSULE | Freq: Three times a day (TID) | ORAL | 0 refills | Status: DC | PRN

## 2020-09-04 MED ORDER — SILDENAFIL CITRATE 20 MG PO TABS: ORAL_TABLET | ORAL | 0 refills | Status: DC

## 2020-09-04 NOTE — Assessment & Plan Note (Addendum)
Could not come into the office today due to allergy/cough symptoms.  We will see him in Janaury 2022. He has refills for now. Continue losartan-HCTZ 100-12.5 mg.

## 2020-09-04 NOTE — Assessment & Plan Note (Signed)
Doing well on sildenafil, taking 2 tablets a few times weekly. Refills sent to pharmacy.

## 2020-09-04 NOTE — Progress Notes (Signed)
Subjective:    Patient ID: Austin Hill, male    DOB: 14-Oct-1965, 54 y.o.   MRN: [id number]  HPI  This visit occurred during the SARS-CoV-2 public health emergency.  Safety protocols were in place, including screening questions prior to the visit, additional usage of staff PPE, and extensive cleaning of exam room while observing appropriate contact time as indicated for disinfecting solutions.   Virtual Visit via Video Note  I connected with Austin Hill on 09/04/20 at  8:00 AM EST by a video enabled telemedicine application and verified that I am speaking with the correct person using two identifiers.  Location: Patient: Writer: Office Participants: patient and myself   I discussed the limitations of evaluation and management by telemedicine and the availability of in person appointments. The patient expressed understanding and agreed to proceed.  History of Present Illness:  Mr. Turay is a 54 year old male who presents today for follow up of chronic conditions and a chief complaint of nasal congestion.  1) Essential Hypertension: Currently managed on losartan-HCTZ 100-12.5 mg. He has not been checking his BP at home. He denies headaches, dizziness, chest pain.   2) Erectile Dysfunction: Currently managed on sildenafil 20 mg. He is needing refills today. He takes 2 pills a few times weekly as needed. Doing well on this regimen.  3) Nasal Congestion: Also with chest congestion, ear fullness, cough, burning to nasal passages. Acute for the last 2-3 days that began while working in [employer]. Today his ears are better. He's been using Zyrtec and Robitussin without improvement.   He denies loss of taste/smell and diarrhea, known exposure to Covid-19. He is not vaccinated against Covid-19.   Observations/Objective:  Alert and oriented. Appears well, not sickly. No distress. Speaking in complete sentences.   Assessment and Plan:  See problem based  charting.  Follow Up Instructions:  You may take Benzonatate capsules for cough. Take 1 capsule by mouth three times daily as needed for cough.  Nasal Congestion/Ear Pressure/Sinus Pressure: Try using Flonase (fluticasone) nasal spray. Instill 1 spray in each nostril twice daily.   Please update me in 1 week if symptoms are no better.  It was a pleasure to see you today! Allie Bossier, NP-C    I discussed the assessment and treatment plan with the patient. The patient was provided an opportunity to ask questions and all were answered. The patient agreed with the plan and demonstrated an understanding of the instructions.   The patient was advised to call back or seek an in-person evaluation if the symptoms worsen or if the condition fails to improve as anticipated.    Pleas Koch, NP    Review of Systems  Constitutional: Negative for fever.  HENT: Positive for congestion, postnasal drip and rhinorrhea. Negative for ear pain.   Respiratory: Positive for cough. Negative for shortness of breath.   Allergic/Immunologic: Positive for environmental allergies.  Neurological: Negative for dizziness and headaches.       History reviewed. No pertinent past medical history.   Social History   Socioeconomic History  . Marital status: Divorced    Spouse name: Not on file  . Number of children: Not on file  . Years of education: Not on file  . Highest education level: Not on file  Occupational History  . Not on file  Tobacco Use  . Smoking status: Current Every Day Smoker    Packs/day: 1.00    Years: 25.00    Pack  years: 25.00    Types: Cigarettes  . Smokeless tobacco: Never Used  Substance and Sexual Activity  . Alcohol use: Yes    Alcohol/week: 0.0 standard drinks    Comment: every couple of days  . Drug use: No  . Sexual activity: Not on file  Other Topics Concern  . Not on file  Social History Narrative   From [place] area.   Single, has girlfriend.   Three  children. His son lives locally.   Enjoy camping, golfing, swimming.   Social Determinants of Health   Financial Resource Strain:   . Difficulty of Paying Living Expenses: Not on file  Food Insecurity:   . Worried About Charity fundraiser in the Last Year: Not on file  . Ran Out of Food in the Last Year: Not on file  Transportation Needs:   . Lack of Transportation (Medical): Not on file  . Lack of Transportation (Non-Medical): Not on file  Physical Activity:   . Days of Exercise per Week: Not on file  . Minutes of Exercise per Session: Not on file  Stress:   . Feeling of Stress : Not on file  Social Connections:   . Frequency of Communication with Friends and Family: Not on file  . Frequency of Social Gatherings with Friends and Family: Not on file  . Attends Religious Services: Not on file  . Active Member of Clubs or Organizations: Not on file  . Attends Archivist Meetings: Not on file  . Marital Status: Not on file  Intimate Partner Violence:   . Fear of Current or Ex-Partner: Not on file  . Emotionally Abused: Not on file  . Physically Abused: Not on file  . Sexually Abused: Not on file    Past Surgical History:  Procedure Laterality Date  . COLONOSCOPY W/ BIOPSIES  about 10 years ago   polyps  . COLONOSCOPY WITH PROPOFOL N/A 10/26/2017   Procedure: COLONOSCOPY WITH PROPOFOL;  Surgeon: Jonathon Bellows, MD;  Location: [facility] ENDOSCOPY;  Service: Gastroenterology;  Laterality: N/A;  . WRIST SURGERY Left 80-68 years old   compound fracture    Family History  Problem Relation Age of Onset  . Aneurysm Father        Abdominal  . Aneurysm Paternal Grandfather        Abdominal     No Known Allergies  Current Outpatient Medications on File Prior to Visit  Medication Sig Dispense Refill  . ibuprofen (ADVIL,MOTRIN) 200 MG tablet Take 200 mg by mouth as needed.    Marland Kitchen losartan-hydrochlorothiazide (HYZAAR) 100-12.5 MG tablet TAKE 1 TABLET BY MOUTH DAILY FOR BLOOD  PRESSURE 90 tablet 1  . Multiple Vitamin (MULTIVITAMIN) tablet Take 1 tablet by mouth daily.    . naproxen (NAPROSYN) 500 MG tablet Take 1 tablet (500 mg total) by mouth 2 (two) times daily with a meal. For pain. 14 tablet 0   No current facility-administered medications on file prior to visit.    Ht 5\' 11"  (1.803 m)   Wt 225 lb (102.1 kg)   BMI 31.38 kg/m    Objective:   Physical Exam Constitutional:      Appearance: He is not ill-appearing.  Pulmonary:     Effort: Pulmonary effort is normal.     Comments: No cough during visit. Neurological:     Mental Status: He is alert.  Psychiatric:        Mood and Affect: Mood normal.  Assessment & Plan:

## 2020-09-04 NOTE — Patient Instructions (Addendum)
   You may take Benzonatate capsules for cough. Take 1 capsule by mouth three times daily as needed for cough.  Nasal Congestion/Ear Pressure/Sinus Pressure: Try using Flonase (fluticasone) nasal spray. Instill 1 spray in each nostril twice daily.   Please update me in 1 week if symptoms are no better.  It was a pleasure to see you today! Allie Bossier, NP-C

## 2020-09-04 NOTE — Assessment & Plan Note (Signed)
Suspect symptoms are from allergy involvement, especially given climate and altitude changes recently.  Rx for Flonase sent to pharmacy. Continue Zyrtec. Tessalon Perles sent to use PRN cough.  He will update.

## 2020-09-08 ENCOUNTER — Telehealth: Payer: Self-pay | Admitting: *Deleted

## 2020-09-08 NOTE — Telephone Encounter (Signed)
I am happy to test him for Covid-19 here. Joellen, do you want to see if he wants testing from our office?

## 2020-09-08 NOTE — Telephone Encounter (Signed)
According to my notes it looks like symptoms began about 6 days ago.  Patient needs to be tested for Covid-19. He may want to proceed to Urgent Care for treatment if he feels he's worse, otherwise we can test him here, it may take up to 48 hours for the test to return.

## 2020-09-08 NOTE — Telephone Encounter (Signed)
Patient called back stating that he has  head and chest congestion, productive cough brownish in color, Patient stated that he does not have a fever but is having chills and body aches. Patient stated that he is having some SOB and some wheezing because of the chest congestion,. Patient stated that his symptoms stated  over a week ago. Patient stated that he has not been vaccinated and has not been tested for covid. Advised patient that this message will be sent to [facility] and we will be back in touch to let him know what she recommends.Patient denies a headache. Patient was given ER precautions and he verbalized understanding.

## 2020-09-08 NOTE — Telephone Encounter (Signed)
Noted. Do recommend Covid-19 testing to rule out.

## 2020-09-08 NOTE — Telephone Encounter (Signed)
Patient left a voicemail stating that he did a virtual visit with Allie Bossier NP Friday. Patient stated that the medication is not helping at all and requested a call back. Called patient back and got his voicemail. Left a message for patient to call the office back.

## 2020-09-08 NOTE — Telephone Encounter (Signed)
Patient notified as instructed by telephone and verbalized understanding. Patient stated that he is not sure if he feels bad enough to go to an UC. Patient was advised that if he does not feel that he needs to go to an UC he can be tested here. Patient was given information on the UC at [facility].  Patient stated that he thinks that he has a sinus infection. Patient was advised if he goes to an UC they can listen to his lungs and order a chest x-ray if they feel that he needs one. Patient was advised that if they feel that he has a sinus infection they can treat him for that also. Patient stated that he wants to think about it and will decide what he wants to do.

## 2020-09-08 NOTE — Telephone Encounter (Signed)
Called and spoke to patient did not want to get covid test. He does not think that is the issue. He has been to work everyday and does not feel that bad. If he changes his mind will call office.

## 2020-09-26 ENCOUNTER — Other Ambulatory Visit: Payer: Self-pay | Admitting: Primary Care

## 2020-09-26 DIAGNOSIS — J3089 Other allergic rhinitis: Secondary | ICD-10-CM

## 2020-10-16 ENCOUNTER — Ambulatory Visit (INDEPENDENT_AMBULATORY_CARE_PROVIDER_SITE_OTHER): Payer: 59 | Admitting: Primary Care

## 2020-10-16 ENCOUNTER — Encounter: Payer: Self-pay | Admitting: Primary Care

## 2020-10-16 ENCOUNTER — Other Ambulatory Visit: Payer: Self-pay

## 2020-10-16 VITALS — BP 120/74 | HR 96 | Temp 97.6°F | Ht 71.0 in | Wt 237.0 lb

## 2020-10-16 DIAGNOSIS — Z125 Encounter for screening for malignant neoplasm of prostate: Secondary | ICD-10-CM

## 2020-10-16 DIAGNOSIS — R7303 Prediabetes: Secondary | ICD-10-CM

## 2020-10-16 DIAGNOSIS — Z0000 Encounter for general adult medical examination without abnormal findings: Secondary | ICD-10-CM | POA: Diagnosis not present

## 2020-10-16 DIAGNOSIS — I10 Essential (primary) hypertension: Secondary | ICD-10-CM

## 2020-10-16 DIAGNOSIS — Z122 Encounter for screening for malignant neoplasm of respiratory organs: Secondary | ICD-10-CM | POA: Diagnosis not present

## 2020-10-16 DIAGNOSIS — Z720 Tobacco use: Secondary | ICD-10-CM

## 2020-10-16 DIAGNOSIS — Z8249 Family history of ischemic heart disease and other diseases of the circulatory system: Secondary | ICD-10-CM

## 2020-10-16 DIAGNOSIS — J3089 Other allergic rhinitis: Secondary | ICD-10-CM

## 2020-10-16 DIAGNOSIS — Z1211 Encounter for screening for malignant neoplasm of colon: Secondary | ICD-10-CM

## 2020-10-16 DIAGNOSIS — Z1159 Encounter for screening for other viral diseases: Secondary | ICD-10-CM

## 2020-10-16 DIAGNOSIS — N529 Male erectile dysfunction, unspecified: Secondary | ICD-10-CM

## 2020-10-16 DIAGNOSIS — Z114 Encounter for screening for human immunodeficiency virus [HIV]: Secondary | ICD-10-CM

## 2020-10-16 LAB — LIPID PANEL
Cholesterol: 175 mg/dL (ref 0–200)
HDL: 70 mg/dL (ref >39.00)
LDL Cholesterol: 91 mg/dL (ref 0–99)
NonHDL: 105.3
Total CHOL/HDL Ratio: 3
Triglycerides: 73 mg/dL (ref 0.0–149.0)
VLDL: 14.6 mg/dL (ref 0.0–40.0)

## 2020-10-16 LAB — COMPREHENSIVE METABOLIC PANEL
ALT: 18 U/L (ref 0–53)
AST: 16 U/L (ref 0–37)
Albumin: 4.6 g/dL (ref 3.5–5.2)
Alkaline Phosphatase: 50 U/L (ref 39–117)
BUN: 13 mg/dL (ref 6–23)
CO2: 26 mEq/L (ref 19–32)
Calcium: 9.7 mg/dL (ref 8.4–10.5)
Chloride: 102 mEq/L (ref 96–112)
Creatinine, Ser: 1.1 mg/dL (ref 0.40–1.50)
GFR: 75.37 mL/min (ref >60.00)
Glucose, Bld: 102 mg/dL — ABNORMAL HIGH (ref 70–99)
Potassium: 4.1 mEq/L (ref 3.5–5.1)
Sodium: 136 mEq/L (ref 135–145)
Total Bilirubin: 0.9 mg/dL (ref 0.2–1.2)
Total Protein: 6.6 g/dL (ref 6.0–8.3)

## 2020-10-16 LAB — PSA: PSA: 0.77 ng/mL (ref 0.10–4.00)

## 2020-10-16 LAB — HEMOGLOBIN A1C: Hgb A1c MFr Bld: 5.8 % (ref 4.6–6.5)

## 2020-10-16 NOTE — Assessment & Plan Note (Signed)
Doing well on sildenafil 20 mg PRN. Continue same.

## 2020-10-16 NOTE — Assessment & Plan Note (Signed)
Last imaging was in August 2019, no aneurysm. Will repeat imaging this Summer 2022.

## 2020-10-16 NOTE — Assessment & Plan Note (Addendum)
Well controlled in the office today, he's not had his losartan-HCTZ 100-12.5 mg for 3 weeks as he just stopped taking. He has plenty of refills and medication at home.   BP today 120/74, discussed for him to monitor BP at home, he has no headaches/dizzines. He will notify me when/if he resumes.   CMP pending.

## 2020-10-16 NOTE — Assessment & Plan Note (Signed)
Intermittent, no symptoms today. Continue OTC treatment PRN.

## 2020-10-16 NOTE — Patient Instructions (Signed)
Stop by the lab prior to leaving today. I will notify you of your results once received.   You will be contacted regarding your referral to GI for the colonoscopy and also for lung cancer screening.  Please let us know if you have not been contacted within two weeks.   Start exercising. You should be getting 150 minutes of moderate intensity exercise weekly.  It's important to improve your diet by reducing consumption of fast food, fried food, processed snack foods, sugary drinks. Increase consumption of fresh vegetables and fruits, whole grains, water.  Ensure you are drinking 64 ounces of water daily.  Please think about the pneumonia and shingles vaccines.  It was a pleasure to see you today!   Preventive Care 29-62 Years Old, Male Preventive care refers to lifestyle choices and visits with your health care provider that can promote health and wellness. This includes:  A yearly physical exam. This is also called an annual wellness visit.  Regular dental and eye exams.  Immunizations.  Screening for certain conditions.  Healthy lifestyle choices, such as: ? Eating a healthy diet. ? Getting regular exercise. ? Not using drugs or products that contain nicotine and tobacco. ? Limiting alcohol use. What can I expect for my preventive care visit? Physical exam Your health care provider will check your:  Height and weight. These may be used to calculate your BMI (body mass index). BMI is a measurement that tells if you are at a healthy weight.  Heart rate and blood pressure.  Body temperature.  Skin for abnormal spots. Counseling Your health care provider may ask you questions about your:  Past medical problems.  Family's medical history.  Alcohol, tobacco, and drug use.  Emotional well-being.  Home life and relationship well-being.  Sexual activity.  Diet, exercise, and sleep habits.  Work and work Statistician.  Access to firearms. What immunizations do I  need? Vaccines are usually given at various ages, according to a schedule. Your health care provider will recommend vaccines for you based on your age, medical history, and lifestyle or other factors, such as travel or where you work.   What tests do I need? Blood tests  Lipid and cholesterol levels. These may be checked every 5 years, or more often if you are over 82 years old.  Hepatitis C test.  Hepatitis B test. Screening  Lung cancer screening. You may have this screening every year starting at age 29 if you have a 30-pack-year history of smoking and currently smoke or have quit within the past 15 years.  Prostate cancer screening. Recommendations will vary depending on your family history and other risks.  Genital exam to check for testicular cancer or hernias.  Colorectal cancer screening. ? All adults should have this screening starting at age 67 and continuing until age 7. ? Your health care provider may recommend screening at age 58 if you are at increased risk. ? You will have tests every 1-10 years, depending on your results and the type of screening test.  Diabetes screening. ? This is done by checking your blood sugar (glucose) after you have not eaten for a while (fasting). ? You may have this done every 1-3 years.  STD (sexually transmitted disease) testing, if you are at risk. Follow these instructions at home: Eating and drinking  Eat a diet that includes fresh fruits and vegetables, whole grains, lean protein, and low-fat dairy products.  Take vitamin and mineral supplements as recommended by your health care provider.  Do not drink alcohol if your health care provider tells you not to drink.  If you drink alcohol: ? Limit how much you have to 0-2 drinks a day. ? Be aware of how much alcohol is in your drink. In the [place] one drink equals one 12 oz bottle of beer (355 mL), one 5 oz glass of wine (148 mL), or one 1 oz glass of hard liquor (44 mL).    Lifestyle  Take daily care of your teeth and gums. Brush your teeth every morning and night with fluoride toothpaste. Floss one time each day.  Stay active. Exercise for at least 30 minutes 5 or more days each week.  Do not use any products that contain nicotine or tobacco, such as cigarettes, e-cigarettes, and chewing tobacco. If you need help quitting, ask your health care provider.  Do not use drugs.  If you are sexually active, practice safe sex. Use a condom or other form of protection to prevent STIs (sexually transmitted infections).  If told by your health care provider, take low-dose aspirin daily starting at age 38.  Find healthy ways to cope with stress, such as: ? Meditation, yoga, or listening to music. ? Journaling. ? Talking to a trusted person. ? Spending time with friends and family. Safety  Always wear your seat belt while driving or riding in a vehicle.  Do not drive: ? If you have been drinking alcohol. Do not ride with someone who has been drinking. ? When you are tired or distracted. ? While texting.  Wear a helmet and other protective equipment during sports activities.  If you have firearms in your house, make sure you follow all gun safety procedures. What's next?  Go to your health care provider once a year for an annual wellness visit.  Ask your health care provider how often you should have your eyes and teeth checked.  Stay up to date on all vaccines. This information is not intended to replace advice given to you by your health care provider. Make sure you discuss any questions you have with your health care provider. Document Revised: 06/18/2019 Document Reviewed: 09/13/2018 Elsevier Patient Education  2021 [employer].

## 2020-10-16 NOTE — Progress Notes (Signed)
Subjective:    Patient ID: Austin Hill, male    DOB: [birth year], 55 y.o.   MRN: [id number]  HPI  This visit occurred during the SARS-CoV-2 public health emergency.  Safety protocols were in place, including screening questions prior to the visit, additional usage of staff PPE, and extensive cleaning of exam room while observing appropriate contact time as indicated for disinfecting solutions.   Austin Hill is a 55 year old male who presents today for complete physical.  Immunizations: -Tetanus: Completed in 2016 -Influenza: Declines  -Shingles: Never completed  -Pneumonia: Never completed  -Covid-19: Declines   Diet: He endorses a fair diet. Limiting fast food. Exercise: Active  Eye exam: Cancelled, he will reschedule  Dental exam: He will reschedule   Colonoscopy: Completed in 2019, over due. Was due in July 2019. PSA: Due Hep C Screen: Due  Lung Cancer Screening: Smoker of 30+  BP Readings from Last 3 Encounters:  10/16/20 120/74  02/28/20 (!) 142/86  02/04/20 (!) 132/94   Wt Readings from Last 3 Encounters:  10/16/20 237 lb (107.5 kg)  09/04/20 225 lb (102.1 kg)  02/28/20 233 lb 4 oz (105.8 kg)     Review of Systems  Constitutional: Negative for unexpected weight change.  HENT: Negative for rhinorrhea.   Eyes: Negative for visual disturbance.  Respiratory: Negative for cough and shortness of breath.   Cardiovascular: Negative for chest pain.  Gastrointestinal: Negative for constipation and diarrhea.  Genitourinary: Negative for difficulty urinating.  Musculoskeletal: Negative for arthralgias and myalgias.  Skin: Negative for rash.  Allergic/Immunologic: Positive for environmental allergies.  Neurological: Negative for dizziness and headaches.  Hematological: Negative for adenopathy.  Psychiatric/Behavioral: The patient is not nervous/anxious.        History reviewed. No pertinent past medical history.   Social History   Socioeconomic History   . Marital status: Divorced    Spouse name: Not on file  . Number of children: Not on file  . Years of education: Not on file  . Highest education level: Not on file  Occupational History  . Not on file  Tobacco Use  . Smoking status: Current Every Day Smoker    Packs/day: 1.00    Years: 25.00    Pack years: 25.00    Types: Cigarettes  . Smokeless tobacco: Never Used  Substance and Sexual Activity  . Alcohol use: Yes    Alcohol/week: 0.0 standard drinks    Comment: every couple of days  . Drug use: No  . Sexual activity: Not on file  Other Topics Concern  . Not on file  Social History Narrative   From [place] area.   Single, has girlfriend.   Three children. His son lives locally.   Enjoy camping, golfing, swimming.   Social Determinants of Health   Financial Resource Strain: Not on file  Food Insecurity: Not on file  Transportation Needs: Not on file  Physical Activity: Not on file  Stress: Not on file  Social Connections: Not on file  Intimate Partner Violence: Not on file    Past Surgical History:  Procedure Laterality Date  . COLONOSCOPY W/ BIOPSIES  about 10 years ago   polyps  . COLONOSCOPY WITH PROPOFOL N/A 10/26/2017   Procedure: COLONOSCOPY WITH PROPOFOL;  Surgeon: Jonathon Bellows, MD;  [facility]: [facility] ENDOSCOPY;  Service: Gastroenterology;  Laterality: N/A;  . WRIST SURGERY Left 15-92 years old   compound fracture    Family History  Problem Relation Age of Onset  . Aneurysm  Father        Abdominal  . Aneurysm Paternal Grandfather        Abdominal     No Known Allergies  Current Outpatient Medications on File Prior to Visit  Medication Sig Dispense Refill  . losartan-hydrochlorothiazide (HYZAAR) 100-12.5 MG tablet TAKE 1 TABLET BY MOUTH DAILY FOR BLOOD PRESSURE (Patient taking differently: Take 1 tablet by mouth daily. For blood pressure. Only takes when his blood pressure is high) 90 tablet 1  . Multiple Vitamin (MULTIVITAMIN) tablet Take 1 tablet  by mouth daily.    . sildenafil (REVATIO) 20 MG tablet Take 2-5 tablets by mouth 30 minutes prior to intercourse 50 tablet 0   No current facility-administered medications on file prior to visit.    BP 120/74   Pulse 96   Temp 97.6 F (36.4 C) (Temporal)   Ht 5\' 11"  (1.803 m)   Wt 237 lb (107.5 kg)   SpO2 96%   BMI 33.05 kg/m    Objective:   Physical Exam Constitutional:      Appearance: He is well-nourished.  HENT:     Right Ear: Tympanic membrane and ear canal normal.     Left Ear: Tympanic membrane and ear canal normal.     Mouth/Throat:     Mouth: Oropharynx is clear and moist.  Eyes:     Extraocular Movements: EOM normal.     Pupils: Pupils are equal, round, and reactive to light.  Cardiovascular:     Rate and Rhythm: Normal rate and regular rhythm.  Pulmonary:     Effort: Pulmonary effort is normal.     Breath sounds: Normal breath sounds.  Abdominal:     General: Bowel sounds are normal.     Palpations: Abdomen is soft.     Tenderness: There is no abdominal tenderness.  Musculoskeletal:        General: Normal range of motion.     Cervical back: Neck supple.  Skin:    General: Skin is warm and dry.  Neurological:     Mental Status: He is alert and oriented to person, place, and time.     Cranial Nerves: No cranial nerve deficit.     Deep Tendon Reflexes:     Reflex Scores:      Patellar reflexes are 2+ on the right side and 2+ on the left side. Psychiatric:        Mood and Affect: Mood and affect and mood normal.            Assessment & Plan:

## 2020-10-16 NOTE — Assessment & Plan Note (Signed)
30+ pack history. Referral placed for lung cancer screening.

## 2020-10-16 NOTE — Assessment & Plan Note (Signed)
Declines pneumonia, influenza, and shingles vaccines despite recommendations.  PSA due and pending. Colonoscopy overdue, referral placed to GI.   Discussed the importance of a healthy diet and regular exercise in order for weight loss, and to reduce the risk of any potential medical problems.  Exam today unremarkable. Labs pending.

## 2020-10-16 NOTE — Assessment & Plan Note (Signed)
Repeat A1C pending.  Discussed the importance of a healthy diet and regular exercise in order for weight loss, and to reduce the risk of any potential medical problems.  

## 2020-10-20 LAB — HEPATITIS C ANTIBODY
Hepatitis C Ab: NONREACTIVE
SIGNAL TO CUT-OFF: 0.01 (ref <1.00)

## 2020-10-20 LAB — HIV ANTIBODY (ROUTINE TESTING W REFLEX): HIV 1&2 Ab, 4th Generation: NONREACTIVE

## 2020-10-21 ENCOUNTER — Telehealth (INDEPENDENT_AMBULATORY_CARE_PROVIDER_SITE_OTHER): Payer: Self-pay | Admitting: Gastroenterology

## 2020-10-21 ENCOUNTER — Other Ambulatory Visit: Payer: Self-pay

## 2020-10-21 DIAGNOSIS — Z86010 Personal history of colonic polyps: Secondary | ICD-10-CM

## 2020-10-21 MED ORDER — NA SULFATE-K SULFATE-MG SULF 17.5-3.13-1.6 GM/177ML PO SOLN: 1.0000 | Freq: Once | ORAL | 0 refills | Status: AC

## 2020-10-21 NOTE — Progress Notes (Signed)
Gastroenterology Pre-Procedure Review  Request Date: Friday 11/27/20 Requesting Physician: Dr. Vicente Males  PATIENT REVIEW QUESTIONS: The patient responded to the following health history questions as indicated:    1. Are you having any GI issues? no 2. Do you have a personal history of Polyps? yes (yes last colonoscopy performed by Dr. Vicente Males 10/26/17.  colon polyps were noted.) 3. Do you have a family history of Colon Cancer or Polyps? no 4. Diabetes Mellitus? no 5. Joint replacements in the past 12 months?no 6. Major health problems in the past 3 months?no 7. Any artificial heart valves, MVP, or defibrillator?no    MEDICATIONS & ALLERGIES:    Patient reports the following regarding taking any anticoagulation/antiplatelet therapy:   Plavix, Coumadin, Eliquis, Xarelto, Lovenox, Pradaxa, Brilinta, or Effient? no Aspirin? no  Patient confirms/reports the following medications:  Current Outpatient Medications  Medication Sig Dispense Refill  . losartan-hydrochlorothiazide (HYZAAR) 100-12.5 MG tablet TAKE 1 TABLET BY MOUTH DAILY FOR BLOOD PRESSURE (Patient taking differently: Take 1 tablet by mouth daily. For blood pressure. Only takes when his blood pressure is high) 90 tablet 1  . Multiple Vitamin (MULTIVITAMIN) tablet Take 1 tablet by mouth daily.    . sildenafil (REVATIO) 20 MG tablet Take 2-5 tablets by mouth 30 minutes prior to intercourse 50 tablet 0   No current facility-administered medications for this visit.    Patient confirms/reports the following allergies:  No Known Allergies  No orders of the defined types were placed in this encounter.   AUTHORIZATION INFORMATION Primary Insurance: 1D#: Group #:  Secondary Insurance: 1D#: Group #:  SCHEDULE INFORMATION: Date: Friday 11/27/20 Time: Location:[facility]

## 2020-10-22 ENCOUNTER — Other Ambulatory Visit: Payer: Self-pay | Admitting: Primary Care

## 2020-10-22 DIAGNOSIS — J3089 Other allergic rhinitis: Secondary | ICD-10-CM

## 2020-10-23 ENCOUNTER — Telehealth: Payer: Self-pay | Admitting: *Deleted

## 2020-10-23 NOTE — Telephone Encounter (Signed)
error 

## 2020-10-23 NOTE — Telephone Encounter (Signed)
Attempted to schedule lung screening scan. However, patient says he would like to consider screening in one month.

## 2020-10-27 ENCOUNTER — Other Ambulatory Visit: Payer: Self-pay | Admitting: Primary Care

## 2020-10-27 DIAGNOSIS — N529 Male erectile dysfunction, unspecified: Secondary | ICD-10-CM

## 2020-11-25 ENCOUNTER — Other Ambulatory Visit: Admission: RE | Admit: 2020-11-25 | Payer: 59 | Source: Ambulatory Visit

## 2020-11-27 ENCOUNTER — Encounter: Admission: RE | Payer: Self-pay | Source: Home / Self Care

## 2020-11-27 ENCOUNTER — Ambulatory Visit: Admission: RE | Admit: 2020-11-27 | Payer: 59 | Source: Home / Self Care | Admitting: Gastroenterology

## 2020-11-27 SURGERY — COLONOSCOPY WITH PROPOFOL
Anesthesia: General

## 2020-12-22 ENCOUNTER — Other Ambulatory Visit: Payer: Self-pay | Admitting: Primary Care

## 2020-12-22 DIAGNOSIS — I10 Essential (primary) hypertension: Secondary | ICD-10-CM

## 2021-01-11 ENCOUNTER — Telehealth: Payer: Self-pay | Admitting: *Deleted

## 2021-01-11 NOTE — Telephone Encounter (Signed)
Received referral for low dose lung cancer screening CT scan. Message left at phone number listed in EMR for patient to call me back to facilitate scheduling scan.  

## 2021-01-18 ENCOUNTER — Encounter: Payer: Self-pay | Admitting: *Deleted

## 2021-03-05 ENCOUNTER — Other Ambulatory Visit: Payer: Self-pay

## 2021-05-08 ENCOUNTER — Other Ambulatory Visit: Payer: Self-pay

## 2021-05-08 ENCOUNTER — Emergency Department
Admission: EM | Admit: 2021-05-08 | Discharge: 2021-05-08 | Disposition: A | Discharge status: Home / Self Care | Payer: 59 | Attending: Emergency Medicine | Admitting: Emergency Medicine

## 2021-05-08 ENCOUNTER — Encounter: Payer: Self-pay | Admitting: Emergency Medicine

## 2021-05-08 ENCOUNTER — Emergency Department: Payer: 59

## 2021-05-08 DIAGNOSIS — M7989 Other specified soft tissue disorders: Secondary | ICD-10-CM | POA: Diagnosis not present

## 2021-05-08 DIAGNOSIS — Z79899 Other long term (current) drug therapy: Secondary | ICD-10-CM | POA: Diagnosis not present

## 2021-05-08 DIAGNOSIS — F17210 Nicotine dependence, cigarettes, uncomplicated: Secondary | ICD-10-CM | POA: Insufficient documentation

## 2021-05-08 DIAGNOSIS — I10 Essential (primary) hypertension: Secondary | ICD-10-CM | POA: Diagnosis not present

## 2021-05-08 HISTORY — DX: Essential (primary) hypertension: I10

## 2021-05-08 NOTE — ED Triage Notes (Signed)
Pt via POV from home. Pt c/o R leg swelling since Thursday. Pt states that he noticed it on Thursday and states that it has been sore since then. Denies blood thinners. Pt is A&OX4 and NAD.

## 2021-05-08 NOTE — ED Provider Notes (Signed)
[facility] Emergency Department Provider Note   ____________________________________________   Event Date/Time   First MD Initiated Contact with Patient 05/08/21 1816     (approximate)  I have reviewed the triage vital signs and the nursing notes.   HISTORY  Chief Complaint Leg Swelling  HPI Austin Hill is a 55 y.o. male who presents to the emergency department for evaluation of right leg swelling that started yesterday.  Patient noted initially some swelling and pain into his upper calf yesterday that then began swelling into the lower right leg and into the ankle today.  Patient was concerned about possibility of blood clot.  He denies any history of clots, is not on any blood thinners.  He denies any recent injury to the leg, though does state that he had right knee injury a few months ago and did receive cortisone injections and was told that he had bone spurs/arthritis.  He denies any acute trauma, has continued to be ambulatory.         Past Medical History:  Diagnosis Date   Hypertension     Patient Active Problem List   Diagnosis Date Noted   Environmental and seasonal allergies 09/04/2020   Cutaneous horn 02/28/2020   Acute right-sided low back pain without sciatica 02/04/2020   Erectile dysfunction 02/06/2019   Family history of abdominal aortic aneurysm (AAA) 05/18/2018   Prediabetes 06/19/2017   Essential hypertension 01/19/2016   Preventative health care 02/10/2015   Sleep disturbance 01/26/2015   Tobacco abuse 01/26/2015   Obesity (BMI 30-39.9) 01/26/2015    Past Surgical History:  Procedure Laterality Date   COLONOSCOPY W/ BIOPSIES  about 10 years ago   polyps   COLONOSCOPY WITH PROPOFOL N/A 10/26/2017   Procedure: COLONOSCOPY WITH PROPOFOL;  Surgeon: Jonathon Bellows, MD;  Location: [facility] ENDOSCOPY;  Service: Gastroenterology;  Laterality: N/A;   WRIST SURGERY Left 41-75 years old   compound fracture    Prior to  Admission medications   Medication Sig Start Date End Date Taking? Authorizing Provider  losartan-hydrochlorothiazide (HYZAAR) 100-12.5 MG tablet TAKE 1 TABLET BY MOUTH DAILY FOR BLOOD PRESSURE 12/23/20   Pleas Koch, NP  meloxicam (MOBIC) 15 MG tablet Take by mouth. 02/11/21   [provider]  Multiple Vitamin (MULTIVITAMIN) tablet Take 1 tablet by mouth daily.    [provider]  sildenafil (REVATIO) 20 MG tablet Take 2-5 tablets by mouth 30 minutes prior to intercourse 09/04/20   Pleas Koch, NP    Allergies Patient has no known allergies.  Family History  Problem Relation Age of Onset   Aneurysm Father        Abdominal   Aneurysm Paternal Grandfather        Abdominal     Social History Social History   Tobacco Use   Smoking status: Every Day    Packs/day: 1.00    Years: 25.00    Pack years: 25.00    Types: Cigarettes   Smokeless tobacco: Never  Substance Use Topics   Alcohol use: Yes    Alcohol/week: 0.0 standard drinks    Comment: every couple of days   Drug use: No    Review of Systems Constitutional: No fever/chills Eyes: No visual changes. ENT: No sore throat. Cardiovascular: Denies chest pain. Respiratory: Denies shortness of breath. Gastrointestinal: No abdominal pain.  No nausea, no vomiting.  No diarrhea.  No constipation. Genitourinary: Negative for dysuria. Musculoskeletal: + Right leg pain and swelling Skin: Negative for rash.  Neurological: Negative for headaches, focal weakness or numbness.   ____________________________________________   PHYSICAL EXAM:  VITAL SIGNS: ED Triage Vitals  Enc Vitals Group     BP 05/08/21 1651 (!) 147/110     Pulse Rate 05/08/21 1651 92     Resp 05/08/21 1651 18     Temp 05/08/21 1651 98.5 F (36.9 C)     Temp Source 05/08/21 1651 Oral     SpO2 05/08/21 1651 97 %     Weight 05/08/21 1652 230 lb (104.3 kg)     Height 05/08/21 1652 '5\' 10"'$  (1.778 m)     Head Circumference --       Peak Flow --      Pain Score 05/08/21 1652 4     Pain Loc --      Pain Edu? --      Excl. in [place]? --    Constitutional: Alert and oriented. Well appearing and in no acute distress. Eyes: Conjunctivae are normal. PERRL. EOMI. Head: Atraumatic. Nose: No congestion/rhinnorhea. Mouth/Throat: Mucous membranes are moist.  Oropharynx non-erythematous. Neck: No stridor.   Cardiovascular: Normal rate, regular rhythm. Grossly normal heart sounds.  Good peripheral circulation. Respiratory: Normal respiratory effort.  No retractions. Lungs CTAB. Gastrointestinal: Soft and nontender. No distention. No abdominal bruits. No CVA tenderness. Musculoskeletal: There is mild swelling noted to the right lower extremity as compared to the left.  1+ edema.  No erythema noted.  Mild tenderness noted to the calf diffusely.  Full range of motion of the right ankle, dorsal pedal pulse 2+, capillary refill within normal limits.  No ecchymosis or evidence of trauma. Neurologic:  Normal speech and language. No gross focal neurologic deficits are appreciated. No gait instability. Skin:  Skin is warm, dry and intact. No rash noted. Psychiatric: Mood and affect are normal. Speech and behavior are normal.   ____________________________________________  RADIOLOGY  Official radiology report(s): US Venous Img Lower Unilateral Right  Result Date: 05/08/2021 CLINICAL DATA:  Right leg swelling EXAM: RIGHT LOWER EXTREMITY VENOUS DOPPLER ULTRASOUND TECHNIQUE: Gray-scale sonography with compression, as well as color and duplex ultrasound, were performed to evaluate the deep venous system(s) from the level of the common femoral vein through the popliteal and proximal calf veins. COMPARISON:  None. FINDINGS: VENOUS Normal compressibility of the common femoral, superficial femoral, and popliteal veins, as well as the visualized calf veins. Visualized portions of profunda femoral vein and great saphenous vein unremarkable. No filling  defects to suggest DVT on grayscale or color Doppler imaging. Doppler waveforms show normal direction of venous flow, normal respiratory plasticity and response to augmentation. Limited views of the contralateral common femoral vein are unremarkable. OTHER None. Limitations: none IMPRESSION: Negative. Electronically Signed   By: Rolm Baptise M.D.   On: 05/08/2021 18:23    ____________________________________________   INITIAL IMPRESSION / ASSESSMENT AND PLAN / ED COURSE  As part of my medical decision making, I reviewed the following data within the [place] notes reviewed and incorporated and Notes from prior ED visits        Patient is a 55 year old male who presents to the emergency department for evaluation of right leg swelling that started yesterday.  See HPI for further details.  In triage, patient has mild hypertension, otherwise normal vital signs.  On physical exam, patient does have slight asymmetrical swelling of the right leg compared to the left.  1+ swelling.  Mild tenderness of the calf.  Compartments are soft, no evidence of  compartment syndrome.  Normal distal pulses and coloration.  No erythema or warmth concerning for cellulitis or infection.  Differentials include possible musculoskeletal strain, ruptured Baker's cyst, DVT  Ultrasound is negative for DVT.  Discussed likely musculoskeletal cause for the symptoms.  Discussed anti-inflammatory use, return precautions.  Advised follow-up with orthopedics.  Patient is amenable with this plan, stable this time for outpatient follow-up.      ____________________________________________   FINAL CLINICAL IMPRESSION(S) / ED DIAGNOSES  Final diagnoses:  Left leg swelling     ED Discharge Orders     None        Note:  This document was prepared using Dragon voice recognition software and may include unintentional dictation errors.    Marlana Salvage, PA 05/08/21 [id number]    Delman Kitten, MD 05/09/21 2039

## 2021-05-08 NOTE — Discharge Instructions (Addendum)
Please take antiinflammatories as previously started. Follow up with orthopedics if symptoms persist. Return to the Er with any concerns for additional redness, increasing pain or other changes.

## 2021-05-11 ENCOUNTER — Telehealth: Payer: Self-pay | Admitting: *Deleted

## 2021-05-11 ENCOUNTER — Encounter: Payer: Self-pay | Admitting: Emergency Medicine

## 2021-05-11 ENCOUNTER — Ambulatory Visit
Admission: EM | Admit: 2021-05-11 | Discharge: 2021-05-11 | Disposition: A | Discharge status: Home / Self Care | Payer: 59

## 2021-05-11 DIAGNOSIS — M79604 Pain in right leg: Secondary | ICD-10-CM | POA: Diagnosis not present

## 2021-05-11 DIAGNOSIS — M25471 Effusion, right ankle: Secondary | ICD-10-CM

## 2021-05-11 NOTE — ED Triage Notes (Signed)
Pt here with right ankle swelling since Saturday. No injury noted and swelling and pain just started on its own.

## 2021-05-11 NOTE — Telephone Encounter (Signed)
PLEASE NOTE: All timestamps contained within this report are represented as [place] Standard Time. CONFIDENTIALTY NOTICE: This fax transmission is intended only for the addressee. It contains information that is legally privileged, confidential or otherwise protected from use or disclosure. If you are not the intended recipient, you are strictly prohibited from reviewing, disclosing, copying using or disseminating any of this information or taking any action in reliance on or regarding this information. If you have received this fax in error, please notify us immediately by telephone so that we can arrange for its return to [place]. Phone: [phone], Toll-Free: [phone], Fax: [phone] Page: 1 of 2 Call Id: [id number] [place] [facility] - Client TELEPHONE ADVICE RECORD [facility] Patient Name: Austin Hill Gender: Male DOB: 1966/06/22 Age: 55 Y 84 M 20 D Return Phone Number: [phone] (Primary) Address: City/ State/ Zip: [place] [address] Client [name] - Client Client Site [place] - Day Physician Alma Friendly - NP Contact Type Call Who Is Calling Patient / Member / Family / Caregiver Call Type Triage / Clinical Relationship To Patient Self Return Phone Number [phone] (Primary) Chief Complaint Leg Pain Reason for Call Symptomatic / Request for [place] has knot on his ankle, and his thigh is sore. Plus it is warm. Translation No Nurse Assessment Nurse: Ysidro Evert, RN, Levada Dy Date/Time (Eastern Time): 05/11/2021 11:44:52 AM Confirm and document reason for call. If symptomatic, describe symptoms. ---Caller states he has swelling in his ankle and calf on the right side. No fever Does the patient have any new or worsening symptoms? ---Yes Will a triage be completed? ---Yes Related visit to physician within the last 2 weeks? ---No Does the PT have any chronic  conditions? (i.e. diabetes, asthma, this includes High risk factors for pregnancy, etc.) ---No Is this a behavioral health or substance abuse call? ---No Guidelines Guideline Title Affirmed Question Affirmed Notes Nurse Date/Time Eilene Ghazi Time) Leg Swelling and Edema [1] Thigh or calf pain AND [2] only 1 side AND [3] present > 1 hour Ysidro Evert, RN, Levada Dy 05/11/2021 11:46:19 AM Disp. Time Eilene Ghazi Time) Disposition Final User 05/11/2021 11:48:33 AM See HCP within 4 Hours (or PCP triage) Yes Ysidro Evert, RN, Levada Dy PLEASE NOTE: All timestamps contained within this report are represented as [place] Standard Time. CONFIDENTIALTY NOTICE: This fax transmission is intended only for the addressee. It contains information that is legally privileged, confidential or otherwise protected from use or disclosure. If you are not the intended recipient, you are strictly prohibited from reviewing, disclosing, copying using or disseminating any of this information or taking any action in reliance on or regarding this information. If you have received this fax in error, please notify us immediately by telephone so that we can arrange for its return to [place]. Phone: [phone], Toll-Free: [phone], Fax: [phone] Page: 2 of 2 Call Id: [id number] [place] Disagree/Comply Comply Caller Understands Yes PreDisposition Did not know what to do Care Advice Given Per Guideline SEE HCP (OR PCP TRIAGE) WITHIN 4 HOURS: * IF OFFICE WILL BE OPEN: You need to be seen within the next 3 or 4 hours. Call your doctor (or NP/PA) now or as soon as the office opens. CARE ADVICE given per Leg Swelling and Edema (Adult) guideline. CALL EMS IF: * Chest pain or shortness of breath occurs. Referrals GO TO FACILITY UNDECIDED

## 2021-05-11 NOTE — Telephone Encounter (Signed)
Noted. We were happy to work in patient as scheduled. He was seen in ED with a negative venous ultrasound.

## 2021-05-11 NOTE — ED Provider Notes (Signed)
[name]    CSN: [id number] Arrival date & time: 05/11/21  1327      History   Chief Complaint Chief Complaint  Patient presents with   Joint Swelling    HPI Austin Hill is a 55 y.o. male.  Patient presents with pain in his right lower leg and swelling of his right ankle x6 days.  No falls or injury.  He denies fever, chills, redness, bruising, wounds, numbness, weakness, paresthesias, pain, shortness of breath, or other symptoms.  Patient was seen in the emergency department on 05/08/2021 for right leg swelling; negative for DVT; instructed to follow-up with orthopedics.  His medical history includes hypertension, current everyday smoker, prediabetes.  Patient states he is no longer taking blood pressure medications that were previously prescribed.  The history is provided by the patient and medical records.   Past Medical History:  Diagnosis Date   Hypertension     Patient Active Problem List   Diagnosis Date Noted   Environmental and seasonal allergies 09/04/2020   Cutaneous horn 02/28/2020   Acute right-sided low back pain without sciatica 02/04/2020   Erectile dysfunction 02/06/2019   Family history of abdominal aortic aneurysm (AAA) 05/18/2018   Prediabetes 06/19/2017   Essential hypertension 01/19/2016   Preventative health care 02/10/2015   Sleep disturbance 01/26/2015   Tobacco abuse 01/26/2015   Obesity (BMI 30-39.9) 01/26/2015    Past Surgical History:  Procedure Laterality Date   COLONOSCOPY W/ BIOPSIES  about 10 years ago   polyps   COLONOSCOPY WITH PROPOFOL N/A 10/26/2017   Procedure: COLONOSCOPY WITH PROPOFOL;  Surgeon: Jonathon Bellows, MD;  Location: [facility] ENDOSCOPY;  Service: Gastroenterology;  Laterality: N/A;   WRIST SURGERY Left 37-15 years old   compound fracture       Home Medications    Prior to Admission medications   Medication Sig Start Date End Date Taking? Authorizing Provider  losartan-hydrochlorothiazide (HYZAAR)  100-12.5 MG tablet TAKE 1 TABLET BY MOUTH DAILY FOR BLOOD PRESSURE 12/23/20   Pleas Koch, NP  meloxicam (MOBIC) 15 MG tablet Take by mouth. 02/11/21   [provider]  Multiple Vitamin (MULTIVITAMIN) tablet Take 1 tablet by mouth daily.    [provider]  sildenafil (REVATIO) 20 MG tablet Take 2-5 tablets by mouth 30 minutes prior to intercourse 09/04/20   Pleas Koch, NP    Family History Family History  Problem Relation Age of Onset   Aneurysm Father        Abdominal   Aneurysm Paternal Grandfather        Abdominal     Social History Social History   Tobacco Use   Smoking status: Every Day    Packs/day: 1.00    Years: 25.00    Pack years: 25.00    Types: Cigarettes   Smokeless tobacco: Never  Substance Use Topics   Alcohol use: Yes    Alcohol/week: 0.0 standard drinks    Comment: every couple of days   Drug use: No     Allergies   Patient has no known allergies.   Review of Systems Review of Systems  Constitutional:  Negative for chills and fever.  Respiratory:  Negative for cough and shortness of breath.   Cardiovascular:  Negative for chest pain and palpitations.  Musculoskeletal:  Positive for joint swelling and myalgias. Negative for arthralgias and back pain.  Skin:  Negative for color change, rash and wound.  Neurological:  Negative for weakness and numbness.  All other  systems reviewed and are negative.   Physical Exam Triage Vital Signs ED Triage Vitals [05/11/21 1330]  Enc Vitals Group     BP      Pulse      Resp      Temp      Temp src      SpO2      Weight      Height      Head Circumference      Peak Flow      Pain Score 4     Pain Loc      Pain Edu?      Excl. in [place]?    No data found.  Updated Vital Signs BP (!) 149/106 (BP Location: Left Arm)   Pulse 98   Temp 98 F (36.7 C) (Oral)   Resp 18   SpO2 95%   Visual Acuity Right Eye Distance:   Left Eye Distance:   Bilateral Distance:    Right  Eye Near:   Left Eye Near:    Bilateral Near:     Physical Exam Vitals and nursing note reviewed.  Constitutional:      General: He is not in acute distress.    Appearance: He is well-developed.  HENT:     Head: Normocephalic and atraumatic.     Mouth/Throat:     Mouth: Mucous membranes are moist.  Eyes:     Conjunctiva/sclera: Conjunctivae normal.  Cardiovascular:     Rate and Rhythm: Normal rate and regular rhythm.     Heart sounds: Normal heart sounds.  Pulmonary:     Effort: Pulmonary effort is normal. No respiratory distress.     Breath sounds: Normal breath sounds.  Abdominal:     Palpations: Abdomen is soft.     Tenderness: There is no abdominal tenderness.  Musculoskeletal:        General: Swelling and tenderness present. No deformity. Normal range of motion.     Cervical back: Neck supple.     Comments: 2+ right ankle edema.  Generalized tenderness of right calf and lower leg.  No erythema, ecchymosis, wounds.  RLE: FROM, strength 5/5, 2+ pulses, brisk cap refill, sensation intact.   Skin:    General: Skin is warm and dry.     Capillary Refill: Capillary refill takes less than 2 seconds.     Findings: No bruising, erythema, lesion or rash.  Neurological:     General: No focal deficit present.     Mental Status: He is alert and oriented to person, place, and time.     Sensory: No sensory deficit.     Motor: No weakness.     Gait: Gait normal.  Psychiatric:        Mood and Affect: Mood normal.        Behavior: Behavior normal.     UC Treatments / Results  Labs (all labs ordered are listed, but only abnormal results are displayed) Labs Reviewed - No data to display  EKG   Radiology No results found.  Procedures Procedures (including critical care time)  Medications Ordered in UC Medications - No data to display  Initial Impression / Assessment and Plan / UC Course  I have reviewed the triage vital signs and the nursing notes.  Pertinent labs &  imaging results that were available during my care of the patient were reviewed by me and considered in my medical decision making (see chart for details).  Lower leg pain, right ankle swelling.  No known injury and no indication of infection.  Patient was seen in the ED on 05/08/2021 and negative for DVT.  He was instructed to follow-up with orthopedics but has not done this yet.  Discussed symptomatic treatment including rest, elevation, ice packs.  Instructed patient to follow-up with orthopedics as soon as possible.  Discussed walk-in clinic at [facility] as he has been seen there previously.  ED precautions discussed.  Patient agrees to plan of care.   Final Clinical Impressions(s) / UC Diagnoses   Final diagnoses:  Right leg pain  Right ankle swelling     Discharge Instructions      Rest and elevate your ankle.  Apply ice packs 2-3 times a day for up to 15 to 20 minutes.  Follow-up with orthopedics.  Go to the emergency department if you have acute worsening symptoms.     ED Prescriptions   None    PDMP not reviewed this encounter.   Sharion Balloon, NP 05/11/21 1357

## 2021-05-11 NOTE — Telephone Encounter (Signed)
Patient called the office stating that he spoke to access nurse and was told that he needed to be seen within 4 hours. Patient stated that he was seen at the ER Saturday ad his leg has gotten worse. Patient was given information on the [facility] if he needed to be seen today. Patient stated that he will plan on going to the UC in [place] today. Patient stated that he had already scheduled an appointment here tomorrow at 4:00 with Romilda Garret NP and to cancel it because he had to be somewhere else at that time. Appointment cancelled as instructed.Marland Kitchen

## 2021-05-11 NOTE — Discharge Instructions (Addendum)
Rest and elevate your ankle.  Apply ice packs 2-3 times a day for up to 15 to 20 minutes.  Follow-up with orthopedics.  Go to the emergency department if you have acute worsening symptoms.

## 2021-05-12 ENCOUNTER — Ambulatory Visit: Payer: 59 | Admitting: Nurse Practitioner

## 2021-05-12 NOTE — Telephone Encounter (Addendum)
This is the note from [facility] walk in clinic on 05/11/21. [2:11 PM] Austin Hill REGARDING THE DX OF RIGHT LOWER LEG SWELLING:  - Patient will wear his compression hose for support.  - Educated on elevation and the use of ice to reduce swelling and inflammation.  - OTC meds (Tylenol) as needed for pain.  - ANTI-INFLAMMATORY meds - to reduce inflammation and swelling  1)Medrol Dose Pack, #1, Take as instructed, no refills  - Patient or patient's family should contact the clinic if questions/concerns or if condition worsens prior to f/u visit.  - Patient is advised that he should see his PCP for evaluation. Patient may also need to see a vascular provider in the future if his symptoms do not improve.  - F/U with J. Darr, PAC in 7-10 days for recheck.   Pt called office and spoke with [name] at front desk to verify that appt was "still on today 05/12/21 at 4:00pm". Appt had been cancelled per 05/11/21 1:13PM note that is included in this phone note. Pt said "he did not realize that the appt today had been cancelled." I asked pt what was bothering him today. Pt still continues with lower rt leg (calf) pain at pain level of 7-8. Pt said rt ankle is very swollen. Pt is wearing compression stockings. There is no redness or warmth in rt lower leg ,ankle or foot.  Pt said he had to work today and has been on his feet all day. Pt said he has not been able to elevate or ice his rt leg or ankle. Pt said overnight the swelling did go down almost completely but the swelling is back and rt ankle is very swollen. Pt said has not had any know injury to rt leg or ankle. Pt had CT that was neg for DVT on 08/0/22 at ED and pt said [facility] did xray of rt leg and foot and pt was told xrays look good.Pt said he has never had anything like this before and [facility] walk in clinic told pt to FU with PCP. I asked pt to hold while I speak with provider. [name] said that [name] has left and will not be  back until 05/25/21. No available appt today at [facility]. Went back on phone to speak with pt and pt was not there. I called pts cell and got V/M, I called pts work # and she said would have to call pt on cell. I left v/m on pts cell for him to call [name]. Sending note to [name] and [name] .teams note also sent to [name] CMA.

## 2021-05-12 NOTE — Telephone Encounter (Signed)
Pt called back; I offered pt an appt at [facility] for 05/13/21 and pt said he just found out that he was going to be out of town on 05/13/21 and was not sure if he would be intown on 05/14/21 or not. Pt said he would cb to schedule appt. UC & ED precautions were given and pt voiced understanding. Also advised pt to elevate rt foot and leg and use ice as directed. Pt voiced understanding.Sending note to [name] and [name].

## 2021-05-13 ENCOUNTER — Ambulatory Visit: Payer: 59 | Admitting: Nurse Practitioner

## 2021-05-17 ENCOUNTER — Encounter: Payer: Self-pay | Admitting: Family Medicine

## 2021-05-17 ENCOUNTER — Other Ambulatory Visit: Payer: Self-pay

## 2021-05-17 ENCOUNTER — Ambulatory Visit: Payer: 59 | Admitting: Family Medicine

## 2021-05-17 VITALS — BP 140/100 | HR 78 | Temp 97.8°F | Ht 71.0 in | Wt 228.0 lb

## 2021-05-17 DIAGNOSIS — T148XXA Other injury of unspecified body region, initial encounter: Secondary | ICD-10-CM | POA: Diagnosis not present

## 2021-05-17 DIAGNOSIS — R7989 Other specified abnormal findings of blood chemistry: Secondary | ICD-10-CM

## 2021-05-17 DIAGNOSIS — M79604 Pain in right leg: Secondary | ICD-10-CM | POA: Diagnosis not present

## 2021-05-17 DIAGNOSIS — I10 Essential (primary) hypertension: Secondary | ICD-10-CM

## 2021-05-17 DIAGNOSIS — R600 Localized edema: Secondary | ICD-10-CM

## 2021-05-17 LAB — CBC WITH DIFFERENTIAL/PLATELET
Basophils Absolute: 0.1 10*3/uL (ref 0.0–0.1)
Basophils Relative: 1 % (ref 0.0–3.0)
Eosinophils Absolute: 0.1 10*3/uL (ref 0.0–0.7)
Eosinophils Relative: 0.7 % (ref 0.0–5.0)
HCT: 50.4 % (ref 39.0–52.0)
Hemoglobin: 17.3 g/dL — ABNORMAL HIGH (ref 13.0–17.0)
Lymphocytes Relative: 19.6 % (ref 12.0–46.0)
Lymphs Abs: 1.4 10*3/uL (ref 0.7–4.0)
MCHC: 34.2 g/dL (ref 30.0–36.0)
MCV: 98.6 fl (ref 78.0–100.0)
Monocytes Absolute: 0.6 10*3/uL (ref 0.1–1.0)
Monocytes Relative: 8.6 % (ref 3.0–12.0)
Neutro Abs: 5.2 10*3/uL (ref 1.4–7.7)
Neutrophils Relative %: 70.1 % (ref 43.0–77.0)
Platelets: 243 10*3/uL (ref 150.0–400.0)
RBC: 5.11 Mil/uL (ref 4.22–5.81)
RDW: 14.2 % (ref 11.5–15.5)
WBC: 7.4 10*3/uL (ref 4.0–10.5)

## 2021-05-17 LAB — PROTIME-INR
INR: 1 ratio (ref 0.8–1.0)
Prothrombin Time: 11.3 s (ref 9.6–13.1)

## 2021-05-17 LAB — BASIC METABOLIC PANEL
BUN: 17 mg/dL (ref 6–23)
CO2: 29 mEq/L (ref 19–32)
Calcium: 10 mg/dL (ref 8.4–10.5)
Chloride: 98 mEq/L (ref 96–112)
Creatinine, Ser: 1.09 mg/dL (ref 0.40–1.50)
GFR: 75.88 mL/min (ref >60.00)
Glucose, Bld: 92 mg/dL (ref 70–99)
Potassium: 3.8 mEq/L (ref 3.5–5.1)
Sodium: 136 mEq/L (ref 135–145)

## 2021-05-17 LAB — HEPATIC FUNCTION PANEL
ALT: 21 U/L (ref 0–53)
AST: 18 U/L (ref 0–37)
Albumin: 4.7 g/dL (ref 3.5–5.2)
Alkaline Phosphatase: 55 U/L (ref 39–117)
Bilirubin, Direct: 0.2 mg/dL (ref 0.0–0.3)
Total Bilirubin: 0.9 mg/dL (ref 0.2–1.2)
Total Protein: 7.3 g/dL (ref 6.0–8.3)

## 2021-05-17 LAB — BRAIN NATRIURETIC PEPTIDE: Pro B Natriuretic peptide (BNP): 94 pg/mL (ref 0.0–100.0)

## 2021-05-17 MED ORDER — HYDROCHLOROTHIAZIDE 12.5 MG PO TABS: 12.5000 mg | ORAL_TABLET | Freq: Every day | ORAL | 3 refills | Status: DC

## 2021-05-17 NOTE — Telephone Encounter (Signed)
[place] [facility] - Client TELEPHONE ADVICE RECORD [facility] Patient Name: Austin Hill Gender: Male DOB: [birth year] Age: 55 Y 78 M 26 D Return Phone Number: [phone] (Primary) Address: City/ State/ Zip: [place]  [address] Client [place] [facility] - Client Client Site [place] - Day Physician Alma Friendly - NP Contact Type Call Who Is Calling Patient / Member / Family / Caregiver Call Type Triage / Clinical Relationship To Patient Self Return Phone Number [phone] (Primary) Chief Complaint NUMBNESS/TINGLING- sudden on one side of the body or face Reason for Call Symptomatic / Request for [place] has been having ankle swelling for the last few weeks but now its turned blue and has tingling/numness. Translation No Nurse Assessment Nurse: Valentino Nose, RN, [place] Date/Time (Eastern Time): 05/17/2021 8:41:52 AM Confirm and document reason for call. If symptomatic, describe symptoms. ---Caller states he had right ankle swelling for the past weeks and now it has turned blue. Feels like he has a fever but does not have a way to check temp. Does the patient have any new or worsening symptoms? ---Yes Will a triage be completed? ---Yes Related visit to physician within the last 2 weeks? ---No Does the PT have any chronic conditions? (i.e. diabetes, asthma, this includes High risk factors for pregnancy, etc.) ---No Is this a behavioral health or substance abuse call? ---No Guidelines Guideline Title Affirmed Question Affirmed Notes Nurse Date/Time Eilene Ghazi Time) Ankle Swelling Patient sounds very sick or weak to the triager Valentino Nose, [place], [place] 05/17/2021 8:43:27 AM Disp. Time Eilene Ghazi Time) Disposition Final User 05/17/2021 8:39:34 AM Send to Urgent Queue Sundra Aland [id number] Go to ED Now (or PCP triage) Yes Valentino Nose, RN, [place] PLEASE NOTE:  All timestamps contained within this report are represented as [place] Standard Time. CONFIDENTIALTY NOTICE: This fax transmission is intended only for the addressee. It contains information that is legally privileged, confidential or otherwise protected from use or disclosure. If you are not the intended recipient, you are strictly prohibited from reviewing, disclosing, copying using or disseminating any of this information or taking any action in reliance on or regarding this information. If you have received this fax in error, please notify us immediately by telephone so that we can arrange for its return to [place]. Phone: [phone], Toll-Free: [phone], Fax: [phone] Page: 2 of 2 Call Id: [id number] [place] Disagree/Comply Comply Caller Understands Yes PreDisposition Call Doctor Care Advice Given Per Guideline GO TO ED NOW (OR PCP TRIAGE): Comments User: Laverna Peace, RN Date/Time Eilene Ghazi Time): 05/17/2021 8:50:33 AM Caller states he does not want to go to UC/ER and wants to be seen in the office. Nurse contacted backline and relayed the message. Notified caller that the triage nurse in the office will be calling him back. Referrals GO TO FACILITY UNDECIDED

## 2021-05-17 NOTE — Progress Notes (Addendum)
Margalit Leece T. Miray Mancino, MD, [place] at [facility] [place], [address]  Phone: [phone]  FAX: [place] - 55 y.o. male  MRN [id number]  Date of Birth: 10/07/1965  Date: 05/17/2021  PCP: Pleas Koch, NP  Referral: Pleas Koch, NP  Chief Complaint  Patient presents with   Joint Swelling    Seen at ED 05/08/2021 Seen at Urgent Care 05/08/2021    This visit occurred during the SARS-CoV-2 public health emergency.  Safety protocols were in place, including screening questions prior to the visit, additional usage of staff PPE, and extensive cleaning of exam room while observing appropriate contact time as indicated for disinfecting solutions.   Subjective:   Austin Hill is a 55 y.o. very pleasant male patient with Body mass index is 31.8 kg/m. who presents with the following:  See my HPI and A/P Addendum: 05/18/21   He presents with some acute leg pain in the calf as well as some bruising in the foot and ankle region without any sort of trauma at all.  Has not had any kind of inversion, eversion injury, no tripping, nothing dropped on his foot, no injury or acute pain in any way.  Left leg pain s/p 8/6 UC visit R leg pain s/p UC visit. 8/9 R DVT US negative, 8/6  2 weeks, went to [facility], no fx on x-ray Neg XR Neg [place]  No injury at all.   Stopped all BP meds BP Readings from Last 3 Encounters:  05/17/21 (!) 140/100  05/11/21 (!) 149/106  05/08/21 (!) 147/110  He was on losartan and hydrochlorothiazide  Addendum: 05/18/21 10:04 AM  He has had isolated lower extremity pain on the right as well as notable swelling posteriorly in the calf with tightness and fullness throughout the lower extremity and increased edema.  At this time, the patient's D-dimer has returned.  I spoke with him for greater than 10 minutes on the telephone, and obtain some additional  history.  Initially, when he had seen urgent care and ER his pain level was about 1 or 2.  He has since had escalating pain levels that are now at about 7 out of 10 at baseline.  His leg remains very swollen, and he also thinks that he feels a textural change posteriorly in the calf.  As above, his ultrasound was done 10 days ago, and at that point his ultrasound was negative for DVT.  Review of Systems is noted in the HPI, as appropriate  Objective:   BP (!) 140/100   Pulse 78   Temp 97.8 F (36.6 C) (Temporal)   Ht '5\' 11"'$  (1.803 m)   Wt 228 lb (103.4 kg)   SpO2 98%   BMI 31.80 kg/m   GEN: No acute distress; alert,appropriate. PULM: Breathing comfortably in no respiratory distress PSYCH: Normally interactive.   He does walk without a limp.  The right lower extremity and foot and ankle approximately 1+ edema compared to trace on the left.  There is also some purplish coloration anterior on the ankle as well as some clears to be bruising on the lateral ankle just distal to the lateral malleolus.  His entire bony anatomy of the foot ankle and lower extremity are normal.  DP and PT pulses are normal.  He does have pain with compression of the calf.  Laboratory and Imaging Data: US Venous Img Lower Unilateral Right  Result  Date: 05/08/2021 CLINICAL DATA:  Right leg swelling EXAM: RIGHT LOWER EXTREMITY VENOUS DOPPLER ULTRASOUND TECHNIQUE: Gray-scale sonography with compression, as well as color and duplex ultrasound, were performed to evaluate the deep venous system(s) from the level of the common femoral vein through the popliteal and proximal calf veins. COMPARISON:  None. FINDINGS: VENOUS Normal compressibility of the common femoral, superficial femoral, and popliteal veins, as well as the visualized calf veins. Visualized portions of profunda femoral vein and great saphenous vein unremarkable. No filling defects to suggest DVT on grayscale or color Doppler imaging. Doppler waveforms  show normal direction of venous flow, normal respiratory plasticity and response to augmentation. Limited views of the contralateral common femoral vein are unremarkable. OTHER None. Limitations: none IMPRESSION: Negative. Electronically Signed   By: Rolm Baptise M.D.   On: 05/08/2021 18:23     Results for orders placed or performed in visit on 05/17/21  CBC with Differential/Platelet  Result Value Ref Range   WBC 7.4 4.0 - 10.5 K/uL   RBC 5.11 4.22 - 5.81 Mil/uL   Hemoglobin 17.3 (H) 13.0 - 17.0 g/dL   HCT 50.4 39.0 - 52.0 %   MCV 98.6 78.0 - 100.0 fl   MCHC 34.2 30.0 - 36.0 g/dL   RDW 14.2 11.5 - 15.5 %   Platelets 243.0 150.0 - 400.0 K/uL   Neutrophils Relative % 70.1 43.0 - 77.0 %   Lymphocytes Relative 19.6 12.0 - 46.0 %   Monocytes Relative 8.6 3.0 - 12.0 %   Eosinophils Relative 0.7 0.0 - 5.0 %   Basophils Relative 1.0 0.0 - 3.0 %   Neutro Abs 5.2 1.4 - 7.7 K/uL   Lymphs Abs 1.4 0.7 - 4.0 K/uL   Monocytes Absolute 0.6 0.1 - 1.0 K/uL   Eosinophils Absolute 0.1 0.0 - 0.7 K/uL   Basophils Absolute 0.1 0.0 - 0.1 K/uL  Basic metabolic panel  Result Value Ref Range   Sodium 136 135 - 145 mEq/L   Potassium 3.8 3.5 - 5.1 mEq/L   Chloride 98 96 - 112 mEq/L   CO2 29 19 - 32 mEq/L   Glucose, Bld 92 70 - 99 mg/dL   BUN 17 6 - 23 mg/dL   Creatinine, Ser 1.09 0.40 - 1.50 mg/dL   GFR 75.88 >60.00 mL/min   Calcium 10.0 8.4 - 10.5 mg/dL  Hepatic function panel  Result Value Ref Range   Total Bilirubin 0.9 0.2 - 1.2 mg/dL   Bilirubin, Direct 0.2 0.0 - 0.3 mg/dL   Alkaline Phosphatase 55 39 - 117 U/L   AST 18 0 - 37 U/L   ALT 21 0 - 53 U/L   Total Protein 7.3 6.0 - 8.3 g/dL   Albumin 4.7 3.5 - 5.2 g/dL  Brain natriuretic peptide  Result Value Ref Range   Pro B Natriuretic peptide (BNP) 94.0 0.0 - 100.0 pg/mL  Protime-INR  Result Value Ref Range   INR 1.0 0.8 - 1.0 ratio   Prothrombin Time 11.3 9.6 - 13.1 sec  D-dimer, quantitative  Result Value Ref Range   D-Dimer, Quant 0.51  (H) <0.50 mcg/mL FEU     Assessment and Plan:     ICD-10-CM   1. Acute pain of right lower extremity  M79.604 CBC with Differential/Platelet    Basic metabolic panel    Hepatic function panel    Brain natriuretic peptide    Protime-INR    D-dimer, quantitative    2. Edema of right lower extremity  R60.0 CBC with  Differential/Platelet    Basic metabolic panel    Hepatic function panel    Brain natriuretic peptide    Protime-INR    D-dimer, quantitative    3. Bruising  T14.8XXA CBC with Differential/Platelet    Basic metabolic panel    Hepatic function panel    Brain natriuretic peptide    Protime-INR    D-dimer, quantitative    4. Essential hypertension  I10     5. Positive D-dimer  R79.89      Pain of the lower extremity, bruising and swelling.  Unclear etiology.  He did have an ultrasound that was negative.  These are not 100% sensitive, so I am going to get a D-dimer.  If this is positive then I would repeat the ultrasound.  Will also get a number of other basic laboratories today.  Uncontrolled hypertension.  He wanted to stop his hypertensive agents, and I am going to have him restart this his hydrochlorothiazide today.  He was previously also on losartan.  Addendum: 05/18/21 10:04 AM  D-dimer has returned positive, and the patient's swelling on the right side has worsened with an escalation of pain to a 7/10 on the pain scale compared to 2/10 when he was initially seen by urgent care and ER.  I discussed this case with radiology at the hospital for their expertise.  While he did have an ultrasound, that was 10 days ago, and certainly venous thromboembolism is a potentially life-threatening event.  With a D-dimer positive in addition to high-level clinical suspicion, I think that this has to be evaluated definitively.  With escalation of pain, symptoms and swelling, I am going to repeat a ultrasound for evaluation of DVT in the right lower extremity.  If this is normal,  then a CT angiogram of the abdomen and pelvis with attention to iliac vein thrombosis and evaluation of the venous structures in the abdomen and pelvis is also appropriate if no DVT is found in the lower extremities.  These vascular structures were not visualized through ultrasound alone.  If the patient does have a venous thromboembolism, he will need to be anticoagulated.  Meds ordered this encounter  Medications   hydrochlorothiazide (HYDRODIURIL) 12.5 MG tablet    Sig: Take 1 tablet (12.5 mg total) by mouth daily.    Dispense:  30 tablet    Refill:  3   Medications Discontinued During This Encounter  Medication Reason   meloxicam (MOBIC) 15 MG tablet Completed Course   Multiple Vitamin (MULTIVITAMIN) tablet Completed Course   losartan-hydrochlorothiazide (HYZAAR) 100-12.5 MG tablet    Orders Placed This Encounter  Procedures   CBC with Differential/Platelet   Basic metabolic panel   Hepatic function panel   Brain natriuretic peptide   Protime-INR   D-dimer, quantitative    Follow-up: No follow-ups on file.  Dr[employer] One speech-to-text software was used for transcription in this dictation.  Possible transcriptional errors can occur using Editor, commissioning.   Signed,  Maud Deed. Ima Hafner, MD   Outpatient Encounter Medications as of 05/17/2021  Medication Sig   hydrochlorothiazide (HYDRODIURIL) 12.5 MG tablet Take 1 tablet (12.5 mg total) by mouth daily.   methylPREDNISolone (MEDROL DOSEPAK) 4 MG TBPK tablet Take 4 mg by mouth.   sildenafil (REVATIO) 20 MG tablet Take 2-5 tablets by mouth 30 minutes prior to intercourse   [DISCONTINUED] meloxicam (MOBIC) 15 MG tablet Take by mouth.   [DISCONTINUED] Multiple Vitamin (MULTIVITAMIN) tablet Take 1 tablet by mouth daily.   [DISCONTINUED] losartan-hydrochlorothiazide (HYZAAR)  100-12.5 MG tablet TAKE 1 TABLET BY MOUTH DAILY FOR BLOOD PRESSURE (Patient not taking: Reported on 05/17/2021)   No facility-administered encounter  medications on file as of 05/17/2021.

## 2021-05-17 NOTE — Telephone Encounter (Signed)
[place] Night - Client Nonclinical Telephone Record AccessNurse Client [place] Night - Client Client Site [place] Physician Alma Friendly - NP Contact Type Call Who Is Calling Patient / Member / Family / Caregiver Caller Name [place] Phone Number [phone] Patient Name Austin Hill Patient DOB 08-18-1966 Call Type Message Only Information Provided Reason for Call Request to Schedule Office Appointment Initial Comment Caller states he is calling to schedule an appointment, his right ankle is swollen. Patient request to speak to RN No Additional Comment Caller declined triage, provided office hours to caller. Disp. Time Disposition Final User 05/17/2021 7:24:59 AM General Information Provided Yes Mockler, Tiffany Call Closed By: Marlou Sa Transaction Date/Time: 05/17/2021 7:22:26 AM (ET)

## 2021-05-17 NOTE — Telephone Encounter (Signed)
See note below the access nurse note; when I spoke with pt he denied any covid symptoms including fever. Sending note to Dr Lorelei Pont who pt has appt with today at 10:20.

## 2021-05-17 NOTE — Telephone Encounter (Addendum)
I spoke with Austin Hill; Austin Hill said he is still having slight lower leg swelling but Austin Hill is wearing compression socks; no redness or warmth to lower rt leg; pain level now is 4 - 5. Austin Hill said continuing with swelling in rt ankle. No CP or SOB. No known injury. Austin Hill has been working and has not been able to elevate leg. Austin Hill said 05/17/21 this morning noticed blue area on outside of foot near the sole of foot. Austin Hill does not want to go to ED and scheduled appt with Dr Lorelei Pont this morning at 10:20. ED precautions given and Austin Hill voiced understanding. No covid symptoms per Austin Hill.Sending note to DR Copland and Butch Penny CMA. Austin Hill was seen [place] [facility] on 05/11/21.

## 2021-05-18 ENCOUNTER — Telehealth: Payer: Self-pay | Admitting: Primary Care

## 2021-05-18 ENCOUNTER — Ambulatory Visit (HOSPITAL_COMMUNITY)
Admission: RE | Admit: 2021-05-18 | Discharge: 2021-05-18 | Disposition: A | Discharge status: Home / Self Care | Payer: 59 | Source: Ambulatory Visit | Attending: Family Medicine | Admitting: Family Medicine

## 2021-05-18 ENCOUNTER — Telehealth: Payer: Self-pay

## 2021-05-18 DIAGNOSIS — R7989 Other specified abnormal findings of blood chemistry: Secondary | ICD-10-CM | POA: Diagnosis not present

## 2021-05-18 DIAGNOSIS — T148XXA Other injury of unspecified body region, initial encounter: Secondary | ICD-10-CM | POA: Insufficient documentation

## 2021-05-18 DIAGNOSIS — M79604 Pain in right leg: Secondary | ICD-10-CM | POA: Insufficient documentation

## 2021-05-18 DIAGNOSIS — R600 Localized edema: Secondary | ICD-10-CM | POA: Insufficient documentation

## 2021-05-18 LAB — D-DIMER, QUANTITATIVE: D-Dimer, Quant: 0.51 mcg/mL FEU — ABNORMAL HIGH (ref <0.50)

## 2021-05-18 NOTE — Telephone Encounter (Signed)
[facility] called critical results at 0840  D-Dimer high @ 0.51

## 2021-05-18 NOTE — Telephone Encounter (Signed)
I think this does make good sense.  He does have some distal bruising and large hematoma would go along with the clinical picture.  I discussed all this with him on the telephone just now.

## 2021-05-18 NOTE — Addendum Note (Signed)
Addended by: Kris Mouton on: 05/18/2021 10:52 AM   Modules accepted: Orders

## 2021-05-18 NOTE — Telephone Encounter (Signed)
I have already seen this.

## 2021-05-18 NOTE — Addendum Note (Signed)
Addended by: Owens Loffler on: 05/18/2021 10:08 AM   Modules accepted: Orders

## 2021-05-18 NOTE — Telephone Encounter (Signed)
I spoke with April at [facility] imaging and she checked and there is no appt or work in appt available 05/18/21.   I spoke with pt and he has [place]  appt 05/18/21 at 1PM at [facility]. Sending note to Dr Lorelei Pont and Butch Penny CMA as Juluis Rainier.

## 2021-05-18 NOTE — Telephone Encounter (Signed)
Helene called from [facility].    Call Report:  Imaging Negative for blood clot.  Interpretation:  Possible large ruptured Baker's Cyst or large hematoma.  Patient denies any injury history.   She is releasing patient home now and we can connect with him on cell if need to contact.   Routing priority to Dr. Lorelei Pont now.

## 2021-05-18 NOTE — Telephone Encounter (Signed)
[name] called in from the imaging center and stated that they received an order for an ultrasound of Mr. Austin Hill right leg, and the imaging  center stated that she only has appointments  tomorrow and he stated that his leg is turning yellow.

## 2021-05-19 ENCOUNTER — Other Ambulatory Visit: Payer: 59

## 2021-06-10 ENCOUNTER — Ambulatory Visit: Payer: 59 | Admitting: Cardiovascular Disease

## 2021-10-23 IMAGING — DX DG LUMBAR SPINE COMPLETE 4+V
5 series · 5 of 5 positions shown · non-contrast
Comparison: None.

CLINICAL DATA: Low back pain

EXAM:
LUMBAR SPINE - COMPLETE 4+ VIEW

[l-spine ap]
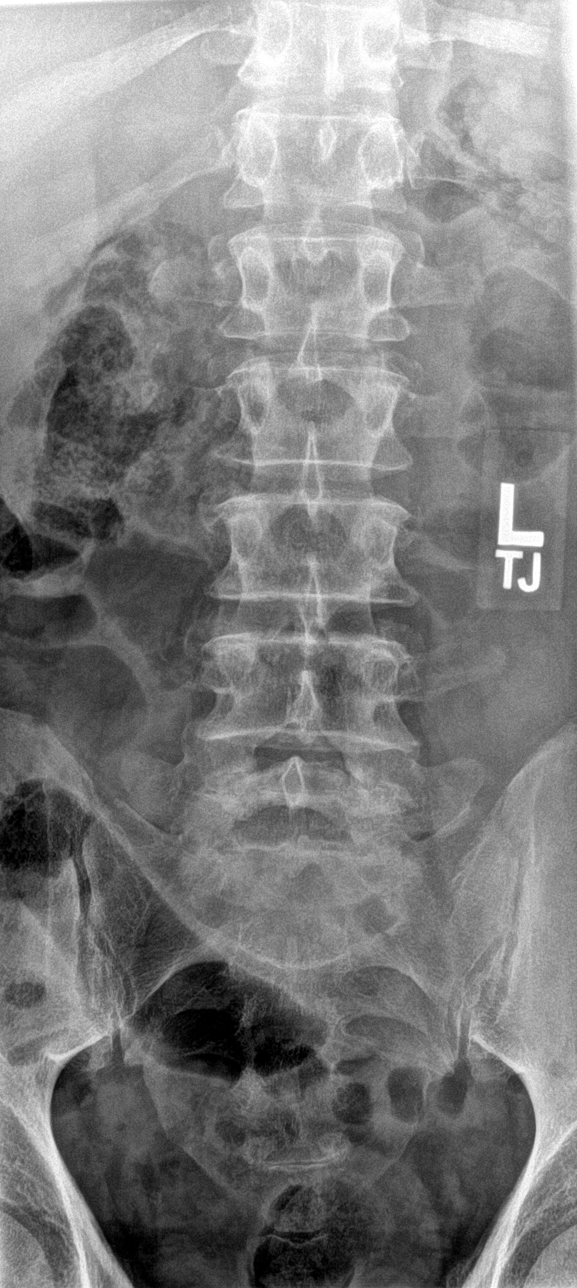

[l-spine obl (1 of 2)]
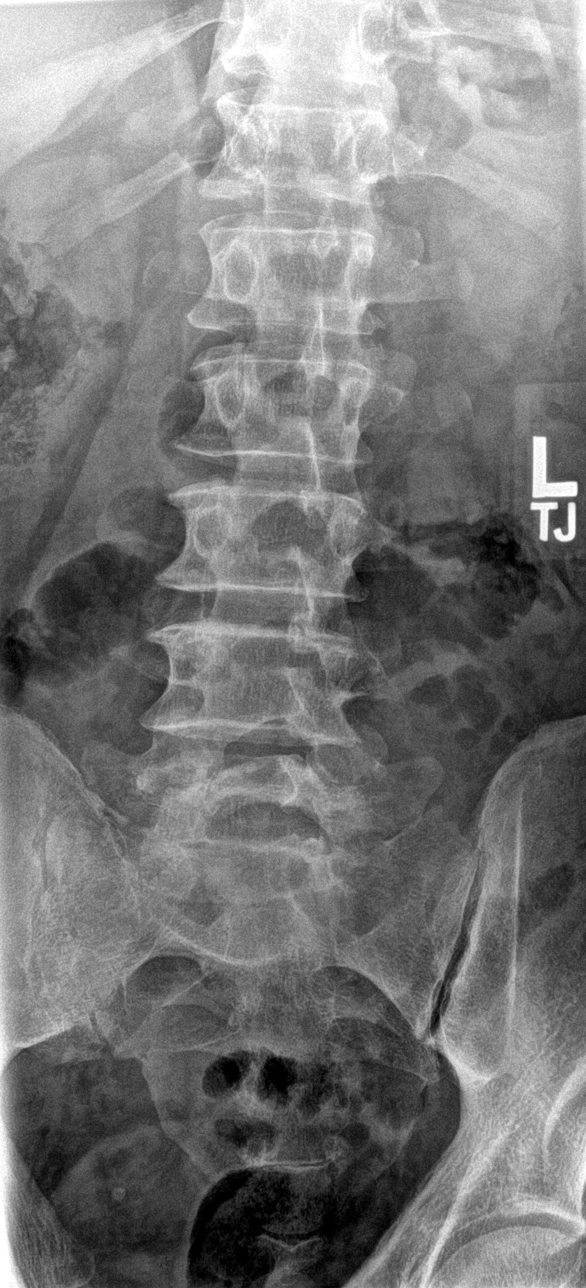

[l-spine obl (2 of 2)]
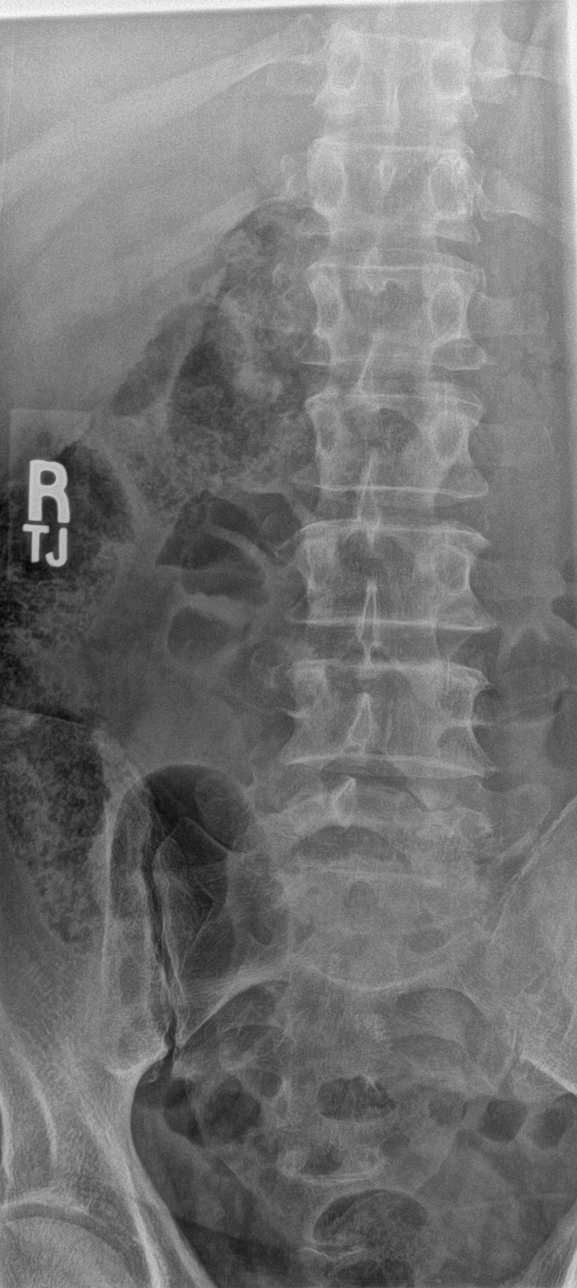

[l-spine lat]
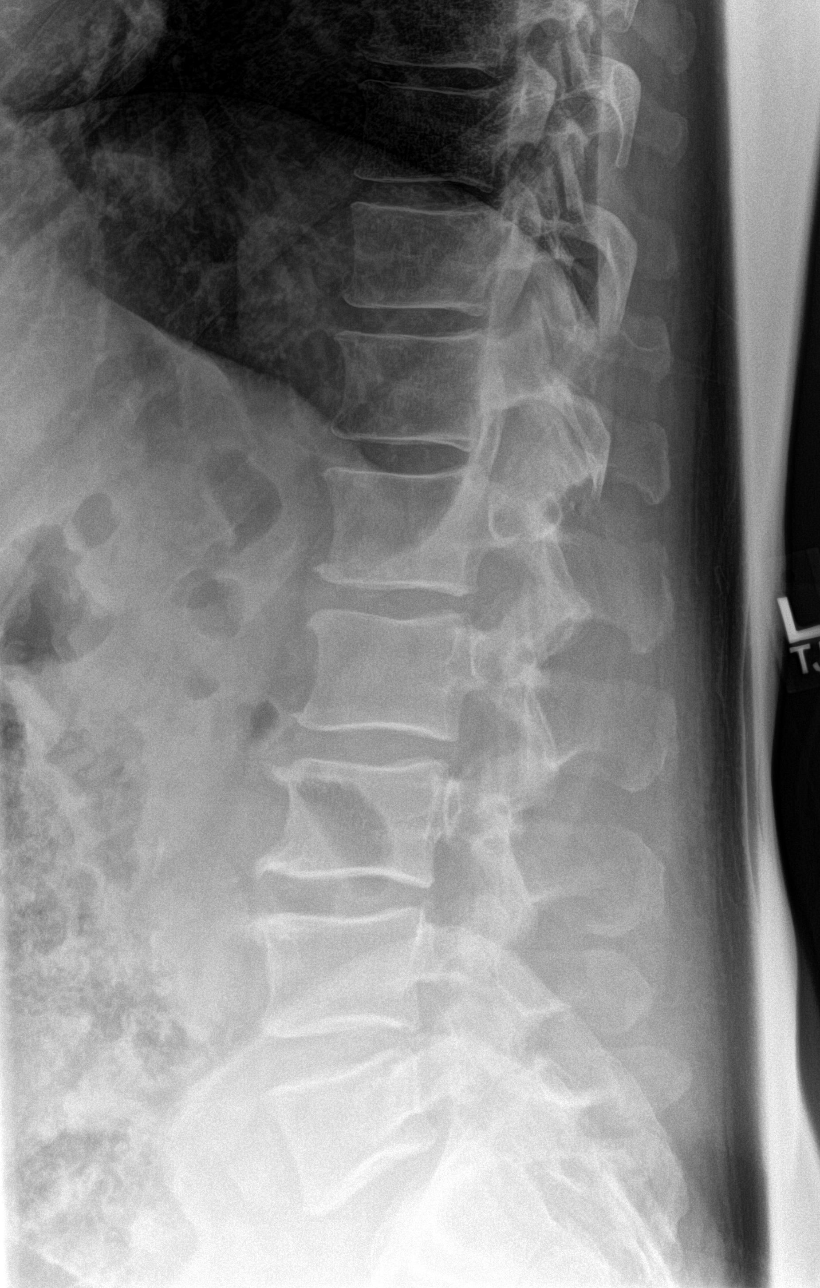

[l-spine l5/s1]
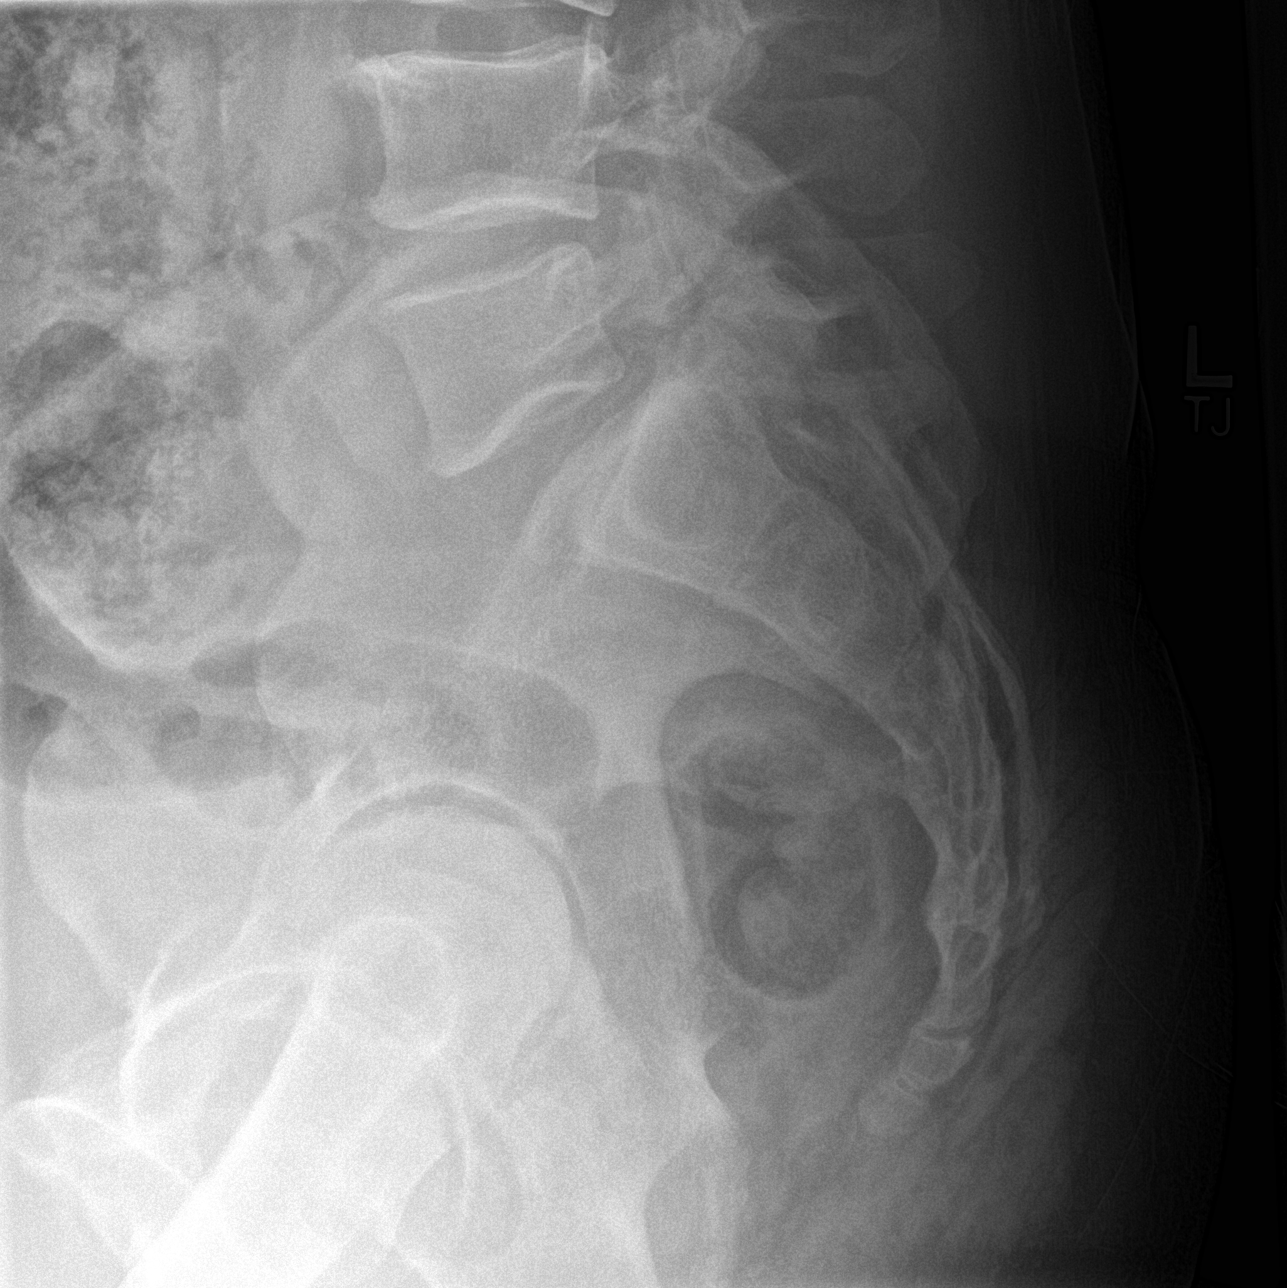

[5 of 5 positions shown; findings below may reference images not displayed]

FINDINGS: Frontal, lateral, spot lumbosacral lateral, and bilateral oblique
views were obtained. There are 5 non-rib-bearing lumbar type
vertebral bodies. There is no fracture or spondylolisthesis. Small
anterior osteophytes are noted at L1, L2, L3. There is facet
osteoarthritic change at L5-S1 bilaterally. Facets at other levels
appear unremarkable.
IMPRESSION: Mild facet osteoarthritic change at L5-S1 bilaterally. No
appreciable disc space narrowing. No fracture or spondylolisthesis.

## 2022-01-04 ENCOUNTER — Other Ambulatory Visit: Payer: Self-pay | Admitting: Primary Care

## 2022-01-04 ENCOUNTER — Encounter: Payer: Self-pay | Admitting: Primary Care

## 2022-01-04 ENCOUNTER — Ambulatory Visit: Payer: 59 | Admitting: Primary Care

## 2022-01-04 VITALS — BP 124/80 | HR 103 | Temp 97.9°F | Ht 71.0 in | Wt 249.3 lb

## 2022-01-04 DIAGNOSIS — I10 Essential (primary) hypertension: Secondary | ICD-10-CM | POA: Diagnosis not present

## 2022-01-04 DIAGNOSIS — N529 Male erectile dysfunction, unspecified: Secondary | ICD-10-CM | POA: Diagnosis not present

## 2022-01-04 DIAGNOSIS — G479 Sleep disorder, unspecified: Secondary | ICD-10-CM

## 2022-01-04 DIAGNOSIS — R7303 Prediabetes: Secondary | ICD-10-CM

## 2022-01-04 DIAGNOSIS — R0609 Other forms of dyspnea: Secondary | ICD-10-CM

## 2022-01-04 DIAGNOSIS — F411 Generalized anxiety disorder: Secondary | ICD-10-CM

## 2022-01-04 LAB — COMPREHENSIVE METABOLIC PANEL
ALT: 23 U/L (ref 0–53)
AST: 21 U/L (ref 0–37)
Albumin: 4.6 g/dL (ref 3.5–5.2)
Alkaline Phosphatase: 56 U/L (ref 39–117)
BUN: 10 mg/dL (ref 6–23)
CO2: 26 mEq/L (ref 19–32)
Calcium: 10 mg/dL (ref 8.4–10.5)
Chloride: 95 mEq/L — ABNORMAL LOW (ref 96–112)
Creatinine, Ser: 1.09 mg/dL (ref 0.40–1.50)
GFR: 75.55 mL/min (ref >60.00)
Glucose, Bld: 95 mg/dL (ref 70–99)
Potassium: 3.9 mEq/L (ref 3.5–5.1)
Sodium: 133 mEq/L — ABNORMAL LOW (ref 135–145)
Total Bilirubin: 0.5 mg/dL (ref 0.2–1.2)
Total Protein: 6.7 g/dL (ref 6.0–8.3)

## 2022-01-04 LAB — LIPID PANEL
Cholesterol: 173 mg/dL (ref 0–200)
HDL: 54.3 mg/dL (ref >39.00)
LDL Cholesterol: 97 mg/dL (ref 0–99)
NonHDL: 118.54
Total CHOL/HDL Ratio: 3
Triglycerides: 106 mg/dL (ref 0.0–149.0)
VLDL: 21.2 mg/dL (ref 0.0–40.0)

## 2022-01-04 LAB — HEMOGLOBIN A1C: Hgb A1c MFr Bld: 6.7 % — ABNORMAL HIGH (ref 4.6–6.5)

## 2022-01-04 MED ORDER — CITALOPRAM HYDROBROMIDE 20 MG PO TABS: 20.0000 mg | ORAL_TABLET | Freq: Every day | ORAL | 0 refills | Status: DC

## 2022-01-04 MED ORDER — SILDENAFIL CITRATE 20 MG PO TABS: ORAL_TABLET | ORAL | 0 refills | Status: DC

## 2022-01-04 MED ORDER — ALBUTEROL SULFATE HFA 108 (90 BASE) MCG/ACT IN AERS
2.0000 | INHALATION_SPRAY | Freq: Four times a day (QID) | RESPIRATORY_TRACT | 0 refills | Status: DC | PRN
Start: 2022-01-04 — End: 2022-06-15

## 2022-01-04 NOTE — Progress Notes (Signed)
? ?Subjective:  ? ? Patient ID: Austin Hill, male    DOB: 01-19-1966, 56 y.o.   MRN: [id number] ? ?HPI ? ?Austin Hill is a very pleasant 56 y.o. male with a history of hypertension, tobacco abuse, prediabetes, environmental and seasonal allergies who presents today for follow-up of chronic conditions. ? ?1) Essential Hypertension: Currently managed on HCTZ 12.5 mg daily. Today he endorses stopping his HCTZ about 6+ months ago, resumed HCTZ about 3 weeks ago due to symptoms of shortness of breath.  ? ?He brings in a bottle today of losartan-HCTZ 100-12.5 mg which is from an older prescription. He's been taking this for three weeks.  ? ?Today he endorses chronic fatigue, feels tired when waking in the morning and throughout the day. He's under a lot of personal stress at work and home, stays awake with mind racing thoughts. He is also selling and buying a house. He is not sleeping well at night, he has difficulty falling and staying sleep. He can sleep if he has alcohol before bed.  ? ?He snores at night per his fiance. She's noticed he twitches a lot at nightly. Wakes during the night.  ? ?2) Shortness of Breath: He's noticed exertional shortness of breath with moderate activity around his home. This began around February 2023. History of smoking for 30+ years, currently smoking 1 PPD. He has an albuterol inhaler at home, uses a few times weekly on average with improvement in SOB symptoms. ? ?He denies chest pain.  ? ?BP Readings from Last 3 Encounters:  ?01/04/22 124/80  ?05/17/21 (!) 140/100  ?05/11/21 (!) 149/106  ? ?3) Erectile Dysfunction: Currently managed on sildenafil 20 mg, 2-5 tablets PRN.  Today he endorses feeling well managed on this regimen, is needing refills. ? ?4) GAD: Chronic for years, intermittent. Symptoms consist of worrying, difficulty sleeping with mind racing thoughts, feeling anxious. Symptoms are worse now. He's under a lot of stress at work and with his personal life.  He's never been treated for anxiety previously. He denies SI/HI, feeling down.  ? ? ?Review of Systems  ?Constitutional:  Positive for fatigue.  ?Respiratory:  Negative for shortness of breath.   ?Cardiovascular:  Negative for chest pain.  ?Psychiatric/Behavioral:  The patient is nervous/anxious.   ? ?   ? ? ?Past Medical History:  ?Diagnosis Date  ? Hypertension   ? ? ?Social History  ? ?Socioeconomic History  ? Marital status: Divorced  ?  Spouse name: Not on file  ? Number of children: Not on file  ? Years of education: Not on file  ? Highest education level: Not on file  ?Occupational History  ? Not on file  ?Tobacco Use  ? Smoking status: Every Day  ?  Packs/day: 1.00  ?  Years: 25.00  ?  Pack years: 25.00  ?  Types: Cigarettes  ? Smokeless tobacco: Never  ?Substance and Sexual Activity  ? Alcohol use: Yes  ?  Alcohol/week: 0.0 standard drinks  ?  Comment: every couple of days  ? Drug use: No  ? Sexual activity: Not on file  ?Other Topics Concern  ? Not on file  ?Social History Narrative  ? From [place] area.  ? Single, has girlfriend.  ? Three children. His son lives locally.  ? Enjoy camping, golfing, swimming.  ? ?Social Determinants of Health  ? ?Financial Resource Strain: Not on file  ?Food Insecurity: Not on file  ?Transportation Needs: Not on file  ?Physical Activity: Not on  file  ?Stress: Not on file  ?Social Connections: Not on file  ?Intimate Partner Violence: Not on file  ? ? ?Past Surgical History:  ?Procedure Laterality Date  ? COLONOSCOPY W/ BIOPSIES  about 10 years ago  ? polyps  ? COLONOSCOPY WITH PROPOFOL N/A 10/26/2017  ? Procedure: COLONOSCOPY WITH PROPOFOL;  Surgeon: Jonathon Bellows, MD;  [facility];  Service: Gastroenterology;  Laterality: N/A;  ? WRIST SURGERY Left 79-98 years old  ? compound fracture  ? ? ?Family History  ?Problem Relation Age of Onset  ? Aneurysm Father   ?     Abdominal  ? Aneurysm Paternal Grandfather   ?     Abdominal   ? ? ?No Known Allergies ? ?Current  Outpatient Medications on File Prior to Visit  ?Medication Sig Dispense Refill  ? hydrochlorothiazide (HYDRODIURIL) 12.5 MG tablet Take 1 tablet (12.5 mg total) by mouth daily. 30 tablet 3  ? ?No current facility-administered medications on file prior to visit.  ? ? ?BP 124/80   Pulse (!) 103   Temp 97.9 ?F (36.6 ?C) (Temporal)   Ht '5\' 11"'$  (1.803 m)   Wt 249 lb 5 oz (113.1 kg)   SpO2 96%   BMI 34.77 kg/m?  ?Objective:  ? Physical Exam ?Cardiovascular:  ?   Rate and Rhythm: Normal rate and regular rhythm.  ?Pulmonary:  ?   Effort: Pulmonary effort is normal.  ?   Breath sounds: Normal breath sounds. No wheezing or rales.  ?Musculoskeletal:  ?   Cervical back: Neck supple.  ?Skin: ?   General: Skin is warm and dry.  ?Neurological:  ?   Mental Status: He is alert and oriented to person, place, and time.  ?Psychiatric:     ?   Mood and Affect: Mood normal.  ? ? ? ? ? ?   ?Assessment & Plan:  ? ? ? ? ?This visit occurred during the SARS-CoV-2 public health emergency.  Safety protocols were in place, including screening questions prior to the visit, additional usage of staff PPE, and extensive cleaning of exam room while observing appropriate contact time as indicated for disinfecting solutions.  ?

## 2022-01-04 NOTE — Assessment & Plan Note (Signed)
Controlled. ? ?Continue losartan-HCTZ 100-12.5 mg once daily.  ?Will add to medication list.  ? ?CMP pending.  ?

## 2022-01-04 NOTE — Assessment & Plan Note (Addendum)
Likely secondary to COPD from long history of tobacco abuse, but will keep cardiac cause on differentials list.  Today he denies chest pain with symptoms. ? ?Continue albuterol inhaler as needed, discussed to notify me if he uses this inhaler more than 3 times weekly as he will need better treatment. ? ?I offered lung cancer screening program, he kindly declines. ? ?Also recommended sleep study for which he also declines. ? ?He will update if symptoms persist. ?

## 2022-01-04 NOTE — Patient Instructions (Signed)
Continue taking losartan-hydrochlorothiazide 100-12.5 mg tablets once daily for blood pressure. ? ?Use the albuterol inhaler if needed for shortness of breath.  Please notify me if you require use of this inhaler more than 3 times weekly. ? ?Please notify me if you develop chest pain with your shortness of breath. ? ?Please consider a sleep study and the lung cancer screening program. ? ?Start citalopram 20 mg once daily for anxiety. Take 1/2 tablet by mouth once daily for about one week, then increase to 1 full tablet thereafter.  ? ?Please schedule a follow up visit for 6 weeks for follow up of anxiety. ? ?It was a pleasure to see you today! ? ?

## 2022-01-04 NOTE — Assessment & Plan Note (Signed)
Repeat A1c pending. 

## 2022-01-04 NOTE — Assessment & Plan Note (Signed)
Controlled. ? ?Continue sildenafil 20 mg 2-5 mg PRN.  ?Refills provided.  ?

## 2022-01-04 NOTE — Assessment & Plan Note (Signed)
Chronic, uncontrolled. ? ?Discussed options for treatment, he opts for medication. ? ?Prescription for citalopram 20 mg sent to pharmacy.  Also cautioned use of alcohol with citalopram ? ?Patient is to take 1/2 tablet daily for 8 days, then advance to 1 full tablet thereafter. ?We discussed possible side effects of headache, GI upset, drowsiness, and SI/HI. ?If thoughts of SI/HI develop, we discussed to present to the emergency immediately. ?Patient verbalized understanding.  ? ?Follow up in 6 weeks for re-evaluation.  ? ?

## 2022-01-04 NOTE — Assessment & Plan Note (Signed)
Chronic and continued, also with daytime fatigue and snoring. ? ?Strongly advise we initiate a sleep study referral, he currently declines at this time but will update if he changes his mind. ?

## 2022-01-05 ENCOUNTER — Other Ambulatory Visit: Payer: Self-pay | Admitting: Primary Care

## 2022-01-05 DIAGNOSIS — E1165 Type 2 diabetes mellitus with hyperglycemia: Secondary | ICD-10-CM

## 2022-01-05 DIAGNOSIS — I10 Essential (primary) hypertension: Secondary | ICD-10-CM

## 2022-01-05 MED ORDER — METFORMIN HCL ER 500 MG PO TB24: 500.0000 mg | ORAL_TABLET | Freq: Every day | ORAL | 1 refills | Status: DC

## 2022-01-05 MED ORDER — LOSARTAN POTASSIUM-HCTZ 100-12.5 MG PO TABS: 1.0000 | ORAL_TABLET | Freq: Every day | ORAL | 3 refills | Status: DC

## 2022-01-05 NOTE — Assessment & Plan Note (Signed)
New diagnosis with A1c of 6.7 which were communicated via phone after labs were collected after his visit. ? ?He has agreed to treatment with metformin XR 500 mg daily.  Prescription sent to pharmacy. ? ?We will see him back in the office in 3 months for follow-up. ?

## 2022-02-15 ENCOUNTER — Encounter: Payer: Self-pay | Admitting: Primary Care

## 2022-02-15 ENCOUNTER — Ambulatory Visit: Payer: 59 | Admitting: Primary Care

## 2022-02-15 VITALS — BP 116/72 | HR 82 | Temp 98.6°F | Ht 71.0 in | Wt 243.0 lb

## 2022-02-15 DIAGNOSIS — E1165 Type 2 diabetes mellitus with hyperglycemia: Secondary | ICD-10-CM

## 2022-02-15 DIAGNOSIS — R229 Localized swelling, mass and lump, unspecified: Secondary | ICD-10-CM | POA: Diagnosis not present

## 2022-02-15 DIAGNOSIS — F411 Generalized anxiety disorder: Secondary | ICD-10-CM

## 2022-02-15 DIAGNOSIS — N529 Male erectile dysfunction, unspecified: Secondary | ICD-10-CM

## 2022-02-15 HISTORY — DX: Localized swelling, mass and lump, unspecified: R22.9

## 2022-02-15 MED ORDER — SERTRALINE HCL 50 MG PO TABS: 50.0000 mg | ORAL_TABLET | Freq: Every day | ORAL | 0 refills | Status: DC

## 2022-02-15 MED ORDER — SILDENAFIL CITRATE 20 MG PO TABS: ORAL_TABLET | ORAL | 0 refills | Status: DC

## 2022-02-15 NOTE — Assessment & Plan Note (Signed)
Exam today is consistent for subcutaneous cyst. ?The cyst today does not appear infectious. ? ?He has a dermatologist and will schedule appointment soon. ?

## 2022-02-15 NOTE — Patient Instructions (Signed)
Start sertraline (Zoloft) 50 mg for anxiety and depression. Take 1/2 tablet by mouth once daily for about one week, then increase to 1 full tablet thereafter.  ? ?Schedule an appointment with your dermatologist. ? ?Keep up the great work regarding your diet! ? ?Schedule a follow-up visit for early July 2023. ? ?It was a pleasure to see you today! ? ?

## 2022-02-15 NOTE — Assessment & Plan Note (Signed)
Intolerant to citalopram after 1 dose, patient did not notify our office. ? ?Stop citalopram 20 mg. ?Start sertraline 50 mg daily. ? ?Patient is to take 1/2 tablet daily for 8 days, then advance to 1 full tablet thereafter. ?We discussed possible side effects of headache, GI upset, drowsiness, and SI/HI. ?If thoughts of SI/HI develop, we discussed to present to the emergency immediately. ?Patient verbalized understanding.  ? ?Follow up in 6 weeks for re-evaluation.  ? ?

## 2022-02-15 NOTE — Assessment & Plan Note (Signed)
Inconsistent use of metformin. ?Commended patient on changes in his diet and lifestyle, encouraged to continue. ? ?Remain off of metformin per patient request. ? ?Repeat A1c in early July 2023. ?

## 2022-02-15 NOTE — Progress Notes (Signed)
? ?Subjective:  ? ? Patient ID: Austin Hill, male    DOB: [birth year], 56 y.o.   MRN: [id number] ? ?HPI ? ?Austin Hill is a very pleasant 56 y.o. male with a history of hypertension, anxiety, chronic fatigue, hyperlipidemia who presents today for follow-up of anxiety. He would also like to discuss a cyst on his back. He is also requesting a refill of his sildenafil. ? ?1) GAD: He was last evaluated on 01/04/2022 for follow-up.  During this visit he endorsed chronic fatigue, increased stress, anxiety, mind racing thoughts.  Given the symptoms we decided to initiate citalopram 20 mg daily.  He is here for follow-up today ? ?Since his last visit he has not taken citalopram. He took one dose which caused diarrhea and "made me feel strange". Overall he's feeling much better as he's sold his home, has moved into his home. He continues to feel anxious.  ? ?He has since changed his diet by cutting out sweet tea, sodas, and sweets. His A1c was 6.7 in April 2023. He's not taken Metformin consistently as he often forgets. He misses his metformin dose 2-3 times weekly.  ? ?2) Skin Mass: Chronic for the last year. He was prescribed antibiotics in February 2023 for an infection in his knee, he noticed the cyst reduce in size. His dog jumped on him a few weeks ago and believes it caused a flare. He would like for me to look today. ? ?BP Readings from Last 3 Encounters:  ?02/15/22 116/72  ?01/04/22 124/80  ?05/17/21 (!) 140/100  ? ? ? ? ? ? ?Review of Systems  ?Respiratory:  Negative for shortness of breath.   ?Cardiovascular:  Negative for chest pain.  ?Gastrointestinal:  Negative for diarrhea.  ?Psychiatric/Behavioral:  The patient is nervous/anxious.   ? ?   ? ? ?Past Medical History:  ?Diagnosis Date  ? Hypertension   ? ? ?Social History  ? ?Socioeconomic History  ? Marital status: Divorced  ?  Spouse name: Not on file  ? Number of children: Not on file  ? Years of education: Not on file  ? Highest education  level: Not on file  ?Occupational History  ? Not on file  ?Tobacco Use  ? Smoking status: Every Day  ?  Packs/day: 1.00  ?  Years: 25.00  ?  Pack years: 25.00  ?  Types: Cigarettes  ? Smokeless tobacco: Never  ?Substance and Sexual Activity  ? Alcohol use: Yes  ?  Alcohol/week: 0.0 standard drinks  ?  Comment: every couple of days  ? Drug use: No  ? Sexual activity: Not on file  ?Other Topics Concern  ? Not on file  ?Social History Narrative  ? From [place] area.  ? Single, has girlfriend.  ? Three children. His son lives locally.  ? Enjoy camping, golfing, swimming.  ? ?Social Determinants of Health  ? ?Financial Resource Strain: Not on file  ?Food Insecurity: Not on file  ?Transportation Needs: Not on file  ?Physical Activity: Not on file  ?Stress: Not on file  ?Social Connections: Not on file  ?Intimate Partner Violence: Not on file  ? ? ?Past Surgical History:  ?Procedure Laterality Date  ? COLONOSCOPY W/ BIOPSIES  about 10 years ago  ? polyps  ? COLONOSCOPY WITH PROPOFOL N/A 10/26/2017  ? Procedure: COLONOSCOPY WITH PROPOFOL;  Surgeon: Jonathon Bellows, MD;  Location: [facility] ENDOSCOPY;  Service: Gastroenterology;  Laterality: N/A;  ? WRIST SURGERY Left 27-67 years old  ? compound  fracture  ? ? ?Family History  ?Problem Relation Age of Onset  ? Aneurysm Father   ?     Abdominal  ? Aneurysm Paternal Grandfather   ?     Abdominal   ? ? ?No Known Allergies ? ?Current Outpatient Medications on File Prior to Visit  ?Medication Sig Dispense Refill  ? albuterol (VENTOLIN HFA) 108 (90 Base) MCG/ACT inhaler Inhale 2 puffs into the lungs every 6 (six) hours as needed for shortness of breath. 1 each 0  ? losartan-hydrochlorothiazide (HYZAAR) 100-12.5 MG tablet Take 1 tablet by mouth daily. for blood pressure. 90 tablet 3  ? ?No current facility-administered medications on file prior to visit.  ? ? ?BP 116/72   Pulse 82   Temp 98.6 ?F (37 ?C) (Oral)   Ht '5\' 11"'$  (1.803 m)   Wt 243 lb (110.2 kg)   SpO2 97%   BMI 33.89 kg/m?   ?Objective:  ? Physical Exam ?Cardiovascular:  ?   Rate and Rhythm: Normal rate and regular rhythm.  ?Pulmonary:  ?   Effort: Pulmonary effort is normal.  ?   Breath sounds: Normal breath sounds. No wheezing or rales.  ?Musculoskeletal:  ?   Cervical back: Neck supple.  ?Skin: ?   General: Skin is warm and dry.  ?   Comments: 1.5 cm X 2 Cm rounded, raised, mostly flesh-colored subcutaneous skin mass to mid upper thoracic back.  Nontender.  Dark rubor color noted to right side of mass.   ?Neurological:  ?   Mental Status: He is alert and oriented to person, place, and time.  ? ? ? ? ? ?   ?Assessment & Plan:  ? ? ? ? ?This visit occurred during the SARS-CoV-2 public health emergency.  Safety protocols were in place, including screening questions prior to the visit, additional usage of staff PPE, and extensive cleaning of exam room while observing appropriate contact time as indicated for disinfecting solutions.  ?

## 2022-03-23 ENCOUNTER — Ambulatory Visit
Admission: EM | Admit: 2022-03-23 | Discharge: 2022-03-23 | Disposition: A | Discharge status: Home / Self Care | Payer: 59 | Attending: Urgent Care | Admitting: Urgent Care

## 2022-03-23 DIAGNOSIS — H6121 Impacted cerumen, right ear: Secondary | ICD-10-CM

## 2022-03-23 DIAGNOSIS — T161XXA Foreign body in right ear, initial encounter: Secondary | ICD-10-CM

## 2022-03-23 MED ORDER — CARBAMIDE PEROXIDE 6.5 % OT SOLN: 5.0000 [drp] | Freq: Every day | OTIC | 0 refills | Status: DC | PRN

## 2022-03-23 NOTE — ED Provider Notes (Addendum)
Renae Gloss   MRN: [id number] DOB: [birth year]  Subjective:   Austin Hill is a 56 y.o. male presenting for 2-week history of persistent right ear fullness.  Patient usually gets his ears washed out, uses Q-tips regularly.  No ear pain, ear drainage, fever.  No current facility-administered medications for this encounter.  Current Outpatient Medications:    albuterol (VENTOLIN HFA) 108 (90 Base) MCG/ACT inhaler, Inhale 2 puffs into the lungs every 6 (six) hours as needed for shortness of breath., Disp: 1 each, Rfl: 0   losartan-hydrochlorothiazide (HYZAAR) 100-12.5 MG tablet, Take 1 tablet by mouth daily. for blood pressure., Disp: 90 tablet, Rfl: 3   sertraline (ZOLOFT) 50 MG tablet, Take 1 tablet (50 mg total) by mouth daily., Disp: 90 tablet, Rfl: 0   sildenafil (REVATIO) 20 MG tablet, Take 2-5 tablets by mouth 30 minutes prior to intercourse, Disp: 50 tablet, Rfl: 0   No Known Allergies  Past Medical History:  Diagnosis Date   Hypertension      Past Surgical History:  Procedure Laterality Date   COLONOSCOPY W/ BIOPSIES  about 10 years ago   polyps   COLONOSCOPY WITH PROPOFOL N/A 10/26/2017   Procedure: COLONOSCOPY WITH PROPOFOL;  Surgeon: Jonathon Bellows, MD;  Location: [facility] ENDOSCOPY;  Service: Gastroenterology;  Laterality: N/A;   WRIST SURGERY Left 68-49 years old   compound fracture    Family History  Problem Relation Age of Onset   Aneurysm Father        Abdominal   Aneurysm Paternal Grandfather        Abdominal     Social History   Tobacco Use   Smoking status: Every Day    Packs/day: 1.00    Years: 25.00    Total pack years: 25.00    Types: Cigarettes   Smokeless tobacco: Never  Substance Use Topics   Alcohol use: Yes    Alcohol/week: 0.0 standard drinks of alcohol    Comment: every couple of days   Drug use: No    ROS   Objective:   Vitals: BP (!) 144/82   Pulse 84   Temp 98.3 F (36.8 C)   Resp 18   SpO2 97%    Physical Exam Constitutional:      General: He is not in acute distress.    Appearance: Normal appearance. He is well-developed and normal weight. He is not ill-appearing, toxic-appearing or diaphoretic.  HENT:     Head: Normocephalic and atraumatic.     Right Ear: Tympanic membrane, ear canal and external ear normal. There is impacted cerumen.     Left Ear: Tympanic membrane, ear canal and external ear normal. There is no impacted cerumen.     Nose: Nose normal.     Mouth/Throat:     Pharynx: Oropharynx is clear.  Eyes:     General: No scleral icterus.       Right eye: No discharge.        Left eye: No discharge.     Extraocular Movements: Extraocular movements intact.  Cardiovascular:     Rate and Rhythm: Normal rate.  Pulmonary:     Effort: Pulmonary effort is normal.  Musculoskeletal:     Cervical back: Normal range of motion.  Neurological:     Mental Status: He is alert and oriented to person, place, and time.  Psychiatric:        Mood and Affect: Mood normal.        Behavior: Behavior normal.  Thought Content: Thought content normal.        Judgment: Judgment normal.    Ear lavage performed using mixture of peroxide and water.  Pressure irrigation performed using a bottle and a thin ear tube.  Right ear lavage.  Curette was used for manual removal.  I was actually able to remove what appeared to be a cotton swab likely from a Q-tip that he uses.   Assessment and Plan :   PDMP not reviewed this encounter.  1. Impacted cerumen of right ear   2. Foreign body of right ear, initial encounter    TM very clear following the ear lavage.  Successful ear lavage, foreign body removal.  Emphasized that he needs to stop using Q-tips.  Recommend Debrox over-the-counter.  Return to clinic if patient continues to have earwax problems or develop signs of ear infection as discussed in clinic. Counseled patient on potential for adverse effects with medications  prescribed/recommended today, ER and return-to-clinic precautions discussed, patient verbalized understanding   Jaynee Eagles, PA-C 03/23/22 1144

## 2022-03-23 NOTE — ED Triage Notes (Signed)
Patient presents to Urgent Care with complaints of right ear fullness 2 weeks. Not tried any OTC drops.

## 2022-04-22 ENCOUNTER — Ambulatory Visit: Payer: 59 | Admitting: Primary Care

## 2022-06-15 ENCOUNTER — Ambulatory Visit
Admission: EM | Admit: 2022-06-15 | Discharge: 2022-06-15 | Disposition: A | Discharge status: Home / Self Care | Payer: 59 | Attending: Emergency Medicine | Admitting: Emergency Medicine

## 2022-06-15 DIAGNOSIS — I10 Essential (primary) hypertension: Secondary | ICD-10-CM | POA: Insufficient documentation

## 2022-06-15 DIAGNOSIS — Z20822 Contact with and (suspected) exposure to covid-19: Secondary | ICD-10-CM | POA: Insufficient documentation

## 2022-06-15 DIAGNOSIS — J209 Acute bronchitis, unspecified: Secondary | ICD-10-CM | POA: Insufficient documentation

## 2022-06-15 DIAGNOSIS — R051 Acute cough: Secondary | ICD-10-CM | POA: Diagnosis not present

## 2022-06-15 MED ORDER — ALBUTEROL SULFATE HFA 108 (90 BASE) MCG/ACT IN AERS: 1.0000 | INHALATION_SPRAY | Freq: Four times a day (QID) | RESPIRATORY_TRACT | 0 refills | Status: DC | PRN

## 2022-06-15 MED ORDER — PREDNISONE 10 MG PO TABS: 40.0000 mg | ORAL_TABLET | Freq: Every day | ORAL | 0 refills | Status: AC

## 2022-06-15 NOTE — ED Triage Notes (Signed)
Patient presents to [facility] for nasal congestion, SOB, and chest congestion since Sunday. Treating symptoms with robitussin and Nyquil Hx of sinus infections.

## 2022-06-15 NOTE — Discharge Instructions (Addendum)
Take the prednisone and use the albuterol inhaler as directed.    Your COVID test is pending.    Follow-up with your primary care provider if your symptoms are not improving.    Your blood pressure is elevated today at 150/102; repeat 146/103.  Please have this rechecked by your primary care provider in 2-4 weeks.

## 2022-06-15 NOTE — ED Provider Notes (Signed)
[facility] Hill    CSN: [id number] Arrival date & time: 06/15/22  1242      History   Chief Complaint Chief Complaint  Patient presents with   Nasal Congestion   Shortness of Breath    HPI Austin Hill is a 56 y.o. male.  Patient presents with 3-day history of nasal congestion, chest congestion, cough.  He also reports occasional mild shortness of breath.  He denies fever, rash, sore throat, chest pain, vomiting, diarrhea, or other symptoms.  Treatment at home with Robitussin and NyQuil.  His medical history includes hypertension and diabetes.  He has not taken his blood pressure medication today.  Current everyday smoker.  The history is provided by the patient and medical records.    Past Medical History:  Diagnosis Date   Hypertension     Patient Active Problem List   Diagnosis Date Noted   Localized skin mass, lump, or swelling 02/15/2022   Exertional dyspnea 01/04/2022   GAD (generalized anxiety disorder) 01/04/2022   Environmental and seasonal allergies 09/04/2020   Cutaneous horn 02/28/2020   Acute right-sided low back pain without sciatica 02/04/2020   Erectile dysfunction 02/06/2019   Family history of abdominal aortic aneurysm (AAA) 05/18/2018   Type 2 diabetes mellitus with hyperglycemia ([place]) 06/19/2017   Essential hypertension 01/19/2016   Preventative health care 02/10/2015   Sleep disturbance 01/26/2015   Tobacco abuse 01/26/2015   Obesity (BMI 30-39.9) 01/26/2015    Past Surgical History:  Procedure Laterality Date   COLONOSCOPY W/ BIOPSIES  about 10 years ago   polyps   COLONOSCOPY WITH PROPOFOL N/A 10/26/2017   Procedure: COLONOSCOPY WITH PROPOFOL;  Surgeon: Jonathon Bellows, MD;  Location: [facility] ENDOSCOPY;  Service: Gastroenterology;  Laterality: N/A;   WRIST SURGERY Left 30-65 years old   compound fracture       Home Medications    Prior to Admission medications   Medication Sig Start Date End Date Taking? Authorizing Provider   albuterol (VENTOLIN HFA) 108 (90 Base) MCG/ACT inhaler Inhale 1-2 puffs into the lungs every 6 (six) hours as needed for wheezing or shortness of breath. 06/15/22  Yes Sharion Balloon, NP  predniSONE (DELTASONE) 10 MG tablet Take 4 tablets (40 mg total) by mouth daily for 5 days. 06/15/22 06/20/22 Yes Sharion Balloon, NP  carbamide peroxide (DEBROX) 6.5 % OTIC solution Place 5 drops into both ears daily as needed. 03/23/22   Jaynee Eagles, PA-C  losartan-hydrochlorothiazide (HYZAAR) 100-12.5 MG tablet Take 1 tablet by mouth daily. for blood pressure. 01/05/22   Pleas Koch, NP  sertraline (ZOLOFT) 50 MG tablet Take 1 tablet (50 mg total) by mouth daily. 02/15/22   Pleas Koch, NP  sildenafil (REVATIO) 20 MG tablet Take 2-5 tablets by mouth 30 minutes prior to intercourse 02/15/22   Pleas Koch, NP    Family History Family History  Problem Relation Age of Onset   Aneurysm Father        Abdominal   Aneurysm Paternal Grandfather        Abdominal     Social History Social History   Tobacco Use   Smoking status: Every Day    Packs/day: 1.00    Years: 25.00    Total pack years: 25.00    Types: Cigarettes   Smokeless tobacco: Never  Substance Use Topics   Alcohol use: Yes    Alcohol/week: 0.0 standard drinks of alcohol    Comment: every couple of days   Drug use:  No     Allergies   Patient has no known allergies.   Review of Systems Review of Systems  Constitutional:  Negative for chills and fever.  HENT:  Positive for congestion. Negative for ear pain and sore throat.   Respiratory:  Positive for cough. Negative for shortness of breath.   Cardiovascular:  Negative for chest pain and palpitations.  Gastrointestinal:  Negative for diarrhea and vomiting.  Skin:  Negative for color change and rash.  All other systems reviewed and are negative.    Physical Exam Triage Vital Signs ED Triage Vitals  Enc Vitals Group     BP 06/15/22 1259 (S) (!) 150/102     Pulse  Rate 06/15/22 1259 82     Resp 06/15/22 1259 16     Temp 06/15/22 1259 97.7 F (36.5 C)     Temp Source 06/15/22 1259 Temporal     SpO2 06/15/22 1259 96 %     Weight --      Height --      Head Circumference --      Peak Flow --      Pain Score 06/15/22 1258 0     Pain Loc --      Pain Edu? --      Excl. in [place]? --    No data found.  Updated Vital Signs BP (!) 146/103   Pulse 82   Temp 97.7 F (36.5 C) (Temporal)   Resp 16   SpO2 96%   Visual Acuity Right Eye Distance:   Left Eye Distance:   Bilateral Distance:    Right Eye Near:   Left Eye Near:    Bilateral Near:     Physical Exam Vitals and nursing note reviewed.  Constitutional:      General: He is not in acute distress.    Appearance: He is well-developed. He is not ill-appearing.  HENT:     Right Ear: Tympanic membrane normal.     Left Ear: Tympanic membrane normal.     Nose: Nose normal.     Mouth/Throat:     Mouth: Mucous membranes are moist.     Pharynx: Oropharynx is clear.  Cardiovascular:     Rate and Rhythm: Normal rate and regular rhythm.     Heart sounds: Normal heart sounds.  Pulmonary:     Effort: Pulmonary effort is normal. No respiratory distress.     Breath sounds: Wheezing present.     Comments: Few expiratory wheezes bilaterally. Musculoskeletal:     Cervical back: Neck supple.  Skin:    General: Skin is warm and dry.  Neurological:     Mental Status: He is alert.  Psychiatric:        Mood and Affect: Mood normal.        Behavior: Behavior normal.      UC Treatments / Results  Labs (all labs ordered are listed, but only abnormal results are displayed) Labs Reviewed  SARS CORONAVIRUS 2 (TAT 6-24 HRS)    EKG   Radiology No results found.  Procedures Procedures (including critical care time)  Medications Ordered in UC Medications - No data to display  Initial Impression / Assessment and Plan / UC Course  I have reviewed the triage vital signs and the nursing  notes.  Pertinent labs & imaging results that were available during my care of the patient were reviewed by me and considered in my medical decision making (see chart for details).   Cough, acute bronchitis.  Elevated blood pressure reading with hypertension.  COVID pending.  Treating with prednisone and albuterol inhaler.  Discussed symptomatic treatment including Tylenol or ibuprofen, rest, hydration.  Instructed patient to avoid OTC medications that can elevate blood pressure.  Instructed patient to follow up with his PCP if his symptoms are not improving.  Also discussed with patient that his blood pressure is elevated today and needs to be rechecked by PCP in 2 to 4 weeks.  Education provided on managing hypertension.  He agrees to plan of care.    Final Clinical Impressions(s) / UC Diagnoses   Final diagnoses:  Acute cough  Acute bronchitis, unspecified organism  Elevated blood pressure reading in office with diagnosis of hypertension     Discharge Instructions      Take the prednisone and use the albuterol inhaler as directed.    Your COVID test is pending.    Follow-up with your primary care provider if your symptoms are not improving.    Your blood pressure is elevated today at 150/102; repeat 146/103.  Please have this rechecked by your primary care provider in 2-4 weeks.            ED Prescriptions     Medication Sig Dispense Auth. Provider   predniSONE (DELTASONE) 10 MG tablet Take 4 tablets (40 mg total) by mouth daily for 5 days. 20 tablet Sharion Balloon, NP   albuterol (VENTOLIN HFA) 108 (90 Base) MCG/ACT inhaler Inhale 1-2 puffs into the lungs every 6 (six) hours as needed for wheezing or shortness of breath. 18 g Sharion Balloon, NP      PDMP not reviewed this encounter.   Sharion Balloon, NP 06/15/22 1406

## 2022-06-16 LAB — SARS CORONAVIRUS 2 (TAT 6-24 HRS): SARS Coronavirus 2: NEGATIVE

## 2022-06-20 ENCOUNTER — Telehealth: Payer: Self-pay

## 2022-06-20 ENCOUNTER — Ambulatory Visit (INDEPENDENT_AMBULATORY_CARE_PROVIDER_SITE_OTHER)
Admission: RE | Admit: 2022-06-20 | Discharge: 2022-06-20 | Disposition: A | Discharge status: Home / Self Care | Payer: 59 | Source: Ambulatory Visit | Attending: Family Medicine | Admitting: Family Medicine

## 2022-06-20 ENCOUNTER — Ambulatory Visit: Payer: 59 | Admitting: Family Medicine

## 2022-06-20 VITALS — BP 100/70 | HR 53 | Temp 97.5°F | Wt 247.2 lb

## 2022-06-20 DIAGNOSIS — J441 Chronic obstructive pulmonary disease with (acute) exacerbation: Secondary | ICD-10-CM

## 2022-06-20 DIAGNOSIS — Z720 Tobacco use: Secondary | ICD-10-CM

## 2022-06-20 DIAGNOSIS — R051 Acute cough: Secondary | ICD-10-CM

## 2022-06-20 MED ORDER — CEFDINIR 300 MG PO CAPS
600.0000 mg | ORAL_CAPSULE | Freq: Every day | ORAL | 0 refills | Status: AC
Start: 2022-06-20 — End: 2022-06-27

## 2022-06-20 MED ORDER — ALBUTEROL SULFATE HFA 108 (90 BASE) MCG/ACT IN AERS: 1.0000 | INHALATION_SPRAY | Freq: Four times a day (QID) | RESPIRATORY_TRACT | 0 refills | Status: DC | PRN

## 2022-06-20 NOTE — Telephone Encounter (Signed)
Noted will see today.    

## 2022-06-20 NOTE — Progress Notes (Signed)
Subjective:     Austin Hill is a 56 y.o. male presenting for Wheezing (Cough, nasal congestion )     Wheezing     #Cough - worse in the morning - low energy - took steroids - no known hx copd - endorses wheezing with cold - tobacco use current  - cough is productive - no fever/chills - sinus congestion   Review of Systems  Respiratory:  Positive for wheezing.     06/15/2022: UC - Cough x 3 days - negative covid - albuterol and prednisone  Social History   Tobacco Use  Smoking Status Every Day   Packs/day: 1.00   Years: 25.00   Total pack years: 25.00   Types: Cigarettes  Smokeless Tobacco Never        Objective:    BP Readings from Last 3 Encounters:  06/20/22 100/70  06/15/22 (!) 146/103  03/23/22 (!) 144/82   Wt Readings from Last 3 Encounters:  06/20/22 247 lb 4 oz (112.2 kg)  02/15/22 243 lb (110.2 kg)  01/04/22 249 lb 5 oz (113.1 kg)    BP 100/70   Pulse (!) 53   Temp (!) 97.5 F (36.4 C) (Temporal)   Wt 247 lb 4 oz (112.2 kg)   SpO2 97%   BMI 34.48 kg/m    Physical Exam Constitutional:      General: He is not in acute distress.    Appearance: He is well-developed. He is not ill-appearing.  HENT:     Head: Normocephalic and atraumatic.     Right Ear: Tympanic membrane and ear canal normal.     Left Ear: Tympanic membrane and ear canal normal.     Nose: Mucosal edema and rhinorrhea present.     Right Sinus: No maxillary sinus tenderness or frontal sinus tenderness.     Left Sinus: No maxillary sinus tenderness or frontal sinus tenderness.     Mouth/Throat:     Pharynx: Uvula midline. Posterior oropharyngeal erythema present. No oropharyngeal exudate.     Tonsils: 0 on the right. 0 on the left.  Cardiovascular:     Rate and Rhythm: Normal rate and regular rhythm.     Heart sounds: No murmur heard. Pulmonary:     Effort: Pulmonary effort is normal. No respiratory distress.     Breath sounds: Examination of the  right-lower field reveals decreased breath sounds and wheezing. Examination of the left-lower field reveals wheezing. Decreased breath sounds and wheezing present.  Musculoskeletal:     Cervical back: Neck supple.  Lymphadenopathy:     Cervical: No cervical adenopathy.  Skin:    General: Skin is warm and dry.     Capillary Refill: Capillary refill takes less than 2 seconds.  Neurological:     Mental Status: He is alert.     CXR (my read): airway thickening        Assessment & Plan:   Problem List Items Addressed This Visit       Other   Tobacco abuse    Advised screening for lung cancer, and that chest x-ray is not sufficient.  Cost is a concern for patient discussed placing referral and that the lung cancer screening group will let him know what the cost is prior to getting the study done.      Relevant Orders   Ambulatory Referral for Lung Cancer Scre   Other Visit Diagnoses     COPD exacerbation ([place])    -  Primary   Relevant  Medications   albuterol (VENTOLIN HFA) 108 (90 Base) MCG/ACT inhaler   cefdinir (OMNICEF) 300 MG capsule   Acute cough       Relevant Medications   albuterol (VENTOLIN HFA) 108 (90 Base) MCG/ACT inhaler   Other Relevant Orders   DG Chest 2 View (Completed)      COPD exacerbation suspected in the setting of long smoking history and bronchitis changes on x-ray.  Treat with cefdinir due to sinus symptoms to cover possible sinus infection.  Advised returning if cough and breathing do not improve after antibiotic course.  At that time could consider pulmonology evaluation or trial of daily inhaler.  Return if symptoms worsen or fail to improve.  Lesleigh Noe, MD

## 2022-06-20 NOTE — Patient Instructions (Addendum)
Antibiotics - either 1 or 2 will send to [facility] - inhaler as needed up to every 4 hours, 1-2 puffs - if not improving, update   Referral to lung cancer screening

## 2022-06-20 NOTE — Telephone Encounter (Signed)
Pt already has appt to see Dr Einar Pheasant 06/20/22 at [place]. Sending note to Dr Einar Pheasant and [facility] CMA.   [place] Night - Client TELEPHONE ADVICE RECORD AccessNurse Patient Name: Austin Hill Gender: Male DOB: 1966/07/21 Age: 56 Y 74 M 29 D Return Phone Number: [phone] (Primary) Address: City/ State/ Zip: [place]  [address] Client [place] Night - Client Client Site [place] - Night Provider Alma Friendly - NP Contact Type Call Who Is Calling Patient / Member / Family / Caregiver Call Type Triage / Clinical Relationship To Patient Self Return Phone Number [phone] (Primary) Chief Complaint WHEEZING Reason for Call Symptomatic / Request for Health Information Initial Comment Caller states not feeling well; [facility] gave prednisone WED and adv if not better to PCP if not better in 5 days and to get chest xray; chest and nasal congestion; wheezing; Translation No Nurse Assessment Nurse: Susy Manor, RN, Megan Date/Time (Eastern Time): 06/20/2022 7:52:38 AM Confirm and document reason for call. If symptomatic, describe symptoms. ---Caller states not feeling well; [facility] gave prednisone WED and adv if not better to PCP if not better in 5 days and to get chest xray; chest and nasal congestion; wheezing. Caller states he was diagnosed with bronchitis. Denies fever. Does the patient have any new or worsening symptoms? ---Yes Will a triage be completed? ---Yes Related visit to physician within the last 2 weeks? ---Yes Does the PT have any chronic conditions? (i.e. diabetes, asthma, this includes High risk factors for pregnancy, etc.) ---Yes List chronic conditions. ---HTN Is this a behavioral health or substance abuse call? ---No Guidelines Guideline Title Affirmed Question Affirmed Notes Nurse Date/Time (Eastern Time) Asthma Attack [1] Wheezing or coughing AND [2] hasn't used  neb or inhaler (up to 3 treatments given 20 minutes apart) AND [3] it's available Susy Manor, RN, Megan 06/20/2022 7:54:00 AM PLEASE NOTE: All timestamps contained within this report are represented as [place] Standard Time. CONFIDENTIALTY NOTICE: This fax transmission is intended only for the addressee. It contains information that is legally privileged, confidential or otherwise protected from use or disclosure. If you are not the intended recipient, you are strictly prohibited from reviewing, disclosing, copying using or disseminating any of this information or taking any action in reliance on or regarding this information. If you have received this fax in error, please notify us immediately by telephone so that we can arrange for its return to [place]. Phone: [phone], Toll-Free: [phone], Fax: [phone] Page: 2 of 2 Call Id: [id number] [place]. Time Eilene Ghazi Time) Disposition Final User 06/20/2022 7:49:14 AM Send to Urgent Queue Jerrye Beavers 06/20/2022 7:58:37 AM See HCP within 4 Hours (or PCP triage) Yes Susy Manor, RN, Megan Final Disposition 06/20/2022 7:58:37 AM See HCP within 4 Hours (or PCP triage) Yes Susy Manor, RN, Megan Disposition Overriden: Urgent Home Treatment with Follow-Up Call Override Reason: Patient's symptoms need a higher level of care Caller Disagree/Comply Comply Caller Understands Yes PreDisposition Call Doctor Care Advice Given Per Guideline SEE HCP (OR PCP TRIAGE) WITHIN 4 HOURS: ASTHMA ATTACK - TREATMENT - QUICK-RELIEF MEDICINE: * If you have not used your inhaler or nebulizer in the PAST 20 MINUTES, take 2 to 4 puffs of your quick-relief ALBUTEROL (SALBUTAMOL) INHALER right now. CALL BACK IF: * You become worse CARE ADVICE given per Asthma Attack (Adult) guideline. Comments User: Sula Soda, RN Date/Time Eilene Ghazi Time): 06/20/2022 7:59:16 AM caller states he has minimal SOB sitting; caller states he was given albuterol  at [facility] but has not been  diagnosed with asthma Referrals REFERRED TO PCP OFFIC

## 2022-06-20 NOTE — Assessment & Plan Note (Signed)
Advised screening for lung cancer, and that chest x-ray is not sufficient.  Cost is a concern for patient discussed placing referral and that the lung cancer screening group will let him know what the cost is prior to getting the study done.

## 2022-07-21 LAB — HM DIABETES EYE EXAM

## 2022-11-10 ENCOUNTER — Encounter: Payer: Self-pay | Admitting: Primary Care

## 2022-11-10 ENCOUNTER — Ambulatory Visit (INDEPENDENT_AMBULATORY_CARE_PROVIDER_SITE_OTHER): Payer: 59 | Admitting: Primary Care

## 2022-11-10 ENCOUNTER — Ambulatory Visit (INDEPENDENT_AMBULATORY_CARE_PROVIDER_SITE_OTHER)
Admission: RE | Admit: 2022-11-10 | Discharge: 2022-11-10 | Disposition: A | Discharge status: Home / Self Care | Payer: 59 | Source: Ambulatory Visit | Attending: Primary Care | Admitting: Primary Care

## 2022-11-10 ENCOUNTER — Telehealth: Payer: Self-pay

## 2022-11-10 VITALS — BP 102/68 | HR 100 | Temp 98.6°F | Ht 71.0 in | Wt 247.0 lb

## 2022-11-10 DIAGNOSIS — F411 Generalized anxiety disorder: Secondary | ICD-10-CM

## 2022-11-10 DIAGNOSIS — Z1211 Encounter for screening for malignant neoplasm of colon: Secondary | ICD-10-CM | POA: Diagnosis not present

## 2022-11-10 DIAGNOSIS — I10 Essential (primary) hypertension: Secondary | ICD-10-CM | POA: Diagnosis not present

## 2022-11-10 DIAGNOSIS — R051 Acute cough: Secondary | ICD-10-CM

## 2022-11-10 DIAGNOSIS — Z0001 Encounter for general adult medical examination with abnormal findings: Secondary | ICD-10-CM | POA: Diagnosis not present

## 2022-11-10 DIAGNOSIS — E1165 Type 2 diabetes mellitus with hyperglycemia: Secondary | ICD-10-CM | POA: Diagnosis not present

## 2022-11-10 DIAGNOSIS — H6121 Impacted cerumen, right ear: Secondary | ICD-10-CM

## 2022-11-10 DIAGNOSIS — Z720 Tobacco use: Secondary | ICD-10-CM

## 2022-11-10 DIAGNOSIS — Z122 Encounter for screening for malignant neoplasm of respiratory organs: Secondary | ICD-10-CM

## 2022-11-10 DIAGNOSIS — Z125 Encounter for screening for malignant neoplasm of prostate: Secondary | ICD-10-CM

## 2022-11-10 DIAGNOSIS — N529 Male erectile dysfunction, unspecified: Secondary | ICD-10-CM

## 2022-11-10 DIAGNOSIS — J42 Unspecified chronic bronchitis: Secondary | ICD-10-CM

## 2022-11-10 LAB — HEMOGLOBIN A1C: Hgb A1c MFr Bld: 6.3 % (ref 4.6–6.5)

## 2022-11-10 LAB — COMPREHENSIVE METABOLIC PANEL
ALT: 46 U/L (ref 0–53)
AST: 38 U/L — ABNORMAL HIGH (ref 0–37)
Albumin: 4.3 g/dL (ref 3.5–5.2)
Alkaline Phosphatase: 53 U/L (ref 39–117)
BUN: 16 mg/dL (ref 6–23)
CO2: 26 mEq/L (ref 19–32)
Calcium: 9.3 mg/dL (ref 8.4–10.5)
Chloride: 96 mEq/L (ref 96–112)
Creatinine, Ser: 1.25 mg/dL (ref 0.40–1.50)
GFR: 63.72 mL/min (ref >60.00)
Glucose, Bld: 105 mg/dL — ABNORMAL HIGH (ref 70–99)
Potassium: 3.8 mEq/L (ref 3.5–5.1)
Sodium: 133 mEq/L — ABNORMAL LOW (ref 135–145)
Total Bilirubin: 0.5 mg/dL (ref 0.2–1.2)
Total Protein: 6.5 g/dL (ref 6.0–8.3)

## 2022-11-10 LAB — POC COVID19 BINAXNOW: SARS Coronavirus 2 Ag: NEGATIVE

## 2022-11-10 LAB — POCT INFLUENZA A/B
Influenza A, POC: NEGATIVE
Influenza B, POC: NEGATIVE

## 2022-11-10 LAB — MICROALBUMIN / CREATININE URINE RATIO
Creatinine,U: 364.3 mg/dL
Microalb Creat Ratio: 0.4 mg/g (ref 0.0–30.0)
Microalb, Ur: 1.5 mg/dL (ref 0.0–1.9)

## 2022-11-10 LAB — LIPID PANEL
Cholesterol: 148 mg/dL (ref 0–200)
HDL: 46.2 mg/dL (ref >39.00)
LDL Cholesterol: 89 mg/dL (ref 0–99)
NonHDL: 102.2
Total CHOL/HDL Ratio: 3
Triglycerides: 67 mg/dL (ref 0.0–149.0)
VLDL: 13.4 mg/dL (ref 0.0–40.0)

## 2022-11-10 LAB — PSA: PSA: 0.93 ng/mL (ref 0.10–4.00)

## 2022-11-10 MED ORDER — PREDNISONE 20 MG PO TABS: ORAL_TABLET | ORAL | 0 refills | Status: DC

## 2022-11-10 MED ORDER — SILDENAFIL CITRATE 20 MG PO TABS: ORAL_TABLET | ORAL | 0 refills | Status: DC

## 2022-11-10 MED ORDER — ALBUTEROL SULFATE HFA 108 (90 BASE) MCG/ACT IN AERS: 1.0000 | INHALATION_SPRAY | Freq: Four times a day (QID) | RESPIRATORY_TRACT | 0 refills | Status: DC | PRN

## 2022-11-10 MED ORDER — CARBAMIDE PEROXIDE 6.5 % OT SOLN: 5.0000 [drp] | Freq: Every day | OTIC | 0 refills | Status: DC | PRN

## 2022-11-10 NOTE — Assessment & Plan Note (Signed)
Controlled.  Continue sildenafil 20 mg PRN Refills provided.

## 2022-11-10 NOTE — Addendum Note (Signed)
Addended by: Pat Kocher on: 11/10/2022 12:04 PM   Modules accepted: Orders

## 2022-11-10 NOTE — Progress Notes (Signed)
Subjective:    Patient ID: Austin Hill, male    DOB: 08-Jun-1966, 57 y.o.   MRN: [id number]  HPI  Austin Hill is a very pleasant 57 y.o. male who presents today for complete physical and follow up of chronic conditions.  He would also like to discuss nasal congestion. Symptom onset yesterday with nasal congestion and fatigue. He then developed cough, chest congestion, fatigue, body aches. He took a home Covid-19 test this morning which was negative. He used his albuterol inhaler   Immunizations: -Tetanus: Completed in 2016 -Influenza: declines  -Shingles: Never completed, declines   Diet: Fair diet.  Exercise: No regular exercise. Active at work.   Eye exam: Completes annually  Dental exam: Completed >1 year ago  Colonoscopy: Completed in January 2019, due July 2019 and never completed  Lung Cancer Screening: Referred in September 2023, never completed   PSA: Due  BP Readings from Last 3 Encounters:  11/10/22 102/68  06/20/22 100/70  06/15/22 (!) 146/103       Review of Systems  Constitutional:  Positive for fatigue. Negative for unexpected weight change.  HENT:  Positive for congestion. Negative for rhinorrhea.   Respiratory:  Positive for cough. Negative for shortness of breath.   Cardiovascular:  Negative for chest pain.  Gastrointestinal:  Negative for constipation and diarrhea.  Genitourinary:  Negative for difficulty urinating.  Musculoskeletal:  Negative for arthralgias and myalgias.  Skin:  Negative for rash.  Allergic/Immunologic: Negative for environmental allergies.  Neurological:  Negative for dizziness and headaches.  Psychiatric/Behavioral:  The patient is not nervous/anxious.          Past Medical History:  Diagnosis Date   Acute right-sided low back pain without sciatica 02/04/2020   Hypertension     Social History   Socioeconomic History   Marital status: Divorced    Spouse name: Not on file   Number of children: Not  on file   Years of education: Not on file   Highest education level: Not on file  Occupational History   Not on file  Tobacco Use   Smoking status: Every Day    Packs/day: 1.00    Years: 25.00    Total pack years: 25.00    Types: Cigarettes   Smokeless tobacco: Never  Substance and Sexual Activity   Alcohol use: Yes    Alcohol/week: 0.0 standard drinks of alcohol    Comment: every couple of days   Drug use: No   Sexual activity: Not on file  Other Topics Concern   Not on file  Social History Narrative   From [place] area.   Single, has girlfriend.   Three children. His son lives locally.   Enjoy camping, golfing, swimming.   Social Determinants of Health   Financial Resource Strain: Not on file  Food Insecurity: Not on file  Transportation Needs: Not on file  Physical Activity: Not on file  Stress: Not on file  Social Connections: Not on file  Intimate Partner Violence: Not on file    Past Surgical History:  Procedure Laterality Date   COLONOSCOPY W/ BIOPSIES  about 10 years ago   polyps   COLONOSCOPY WITH PROPOFOL N/A 10/26/2017   Procedure: COLONOSCOPY WITH PROPOFOL;  Surgeon: Jonathon Bellows, MD;  Location[facility];  Service: Gastroenterology;  Laterality: N/A;   WRIST SURGERY Left 76-54 years old   compound fracture    Family History  Problem Relation Age of Onset   Aneurysm Father  Abdominal   Aneurysm Paternal Grandfather        Abdominal     No Known Allergies  Current Outpatient Medications on File Prior to Visit  Medication Sig Dispense Refill   losartan-hydrochlorothiazide (HYZAAR) 100-12.5 MG tablet Take 1 tablet by mouth daily. for blood pressure. 90 tablet 3   No current facility-administered medications on file prior to visit.    BP 102/68   Pulse 100   Temp 98.6 F (37 C) (Temporal)   Ht '5\' 11"'$  (1.803 m)   Wt 247 lb (112 kg)   SpO2 98%   BMI 34.45 kg/m  Objective:   Physical Exam HENT:     Right Ear: There is impacted  cerumen.     Left Ear: Tympanic membrane and ear canal normal.     Nose: Nose normal.     Right Sinus: No maxillary sinus tenderness or frontal sinus tenderness.     Left Sinus: No maxillary sinus tenderness or frontal sinus tenderness.  Eyes:     Conjunctiva/sclera: Conjunctivae normal.  Neck:     Thyroid: No thyromegaly.     Vascular: No carotid bruit.  Cardiovascular:     Rate and Rhythm: Normal rate and regular rhythm.     Heart sounds: Normal heart sounds.  Pulmonary:     Effort: Pulmonary effort is normal.     Breath sounds: Examination of the left-upper field reveals rhonchi. Examination of the left-lower field reveals rhonchi. Rhonchi present. No wheezing or rales.  Abdominal:     General: Bowel sounds are normal.     Palpations: Abdomen is soft.     Tenderness: There is no abdominal tenderness.  Musculoskeletal:        General: Normal range of motion.     Cervical back: Neck supple.  Skin:    General: Skin is warm and dry.  Neurological:     Mental Status: He is alert and oriented to person, place, and time.     Cranial Nerves: No cranial nerve deficit.     Deep Tendon Reflexes: Reflexes are normal and symmetric.  Psychiatric:        Mood and Affect: Mood normal.           Assessment & Plan:  Encounter for annual general medical examination with abnormal findings in adult Assessment & Plan: Declines influenza and Shingles vaccines. PSA due and pending. Colonoscopy overdue, referral placed to GI.  Discussed the importance of a healthy diet and regular exercise in order for weight loss, and to reduce the risk of further co-morbidity.  Exam stable. Labs pending.  Follow up in 1 year for repeat physical.    Type 2 diabetes mellitus with hyperglycemia, without long-term current use of insulin ([place]) Assessment & Plan: Repeat A1C pending. Remain off treatment for now.  Urine microalbumin due and pending.  Await results.  Follow up in 3-6 months  depending on A1c result   Orders: -     Microalbumin / creatinine urine ratio -     Hemoglobin A1c -     Lipid panel  Screening for colon cancer -     Ambulatory referral to Gastroenterology  Essential hypertension Assessment & Plan: Controlled, borderline low, discussed this with patient today.  He is asymptomatic, has no concerns.  Continue losartan-HCTZ 100-12.5 mg for now. Consider discontinuation of HCTZ. CMP pending.  Orders: -     Comprehensive metabolic panel -     Lipid panel  Screening for prostate cancer -  PSA  Acute cough Assessment & Plan: Negative influenza and Covid today.  Xray ordered and pending given exam. Start prednisone 40 mg daily x 5 days for potential COPD exacerbation.  Refill provided for albuterol inhaler.   Orders: -     DG Chest 2 View -     predniSONE; Take 2 tablets by mouth once daily in the morning for 5 days.  Dispense: 10 tablet; Refill: 0  Erectile dysfunction, unspecified erectile dysfunction type Assessment & Plan: Controlled.  Continue sildenafil 20 mg PRN Refills provided.   Orders: -     Sildenafil Citrate; Take 2-5 tablets by mouth 30 minutes prior to intercourse  Dispense: 50 tablet; Refill: 0  Impacted cerumen of right ear Assessment & Plan: Refill provided for Debrox drops.  Orders: -     Carbamide Peroxide; Place 5 drops into both ears daily as needed.  Dispense: 15 mL; Refill: 0  Screening for lung cancer -     Ambulatory Referral for Lung Cancer Scre  Chronic bronchitis, unspecified chronic bronchitis type ([place]) Assessment & Plan: Possibly acute on chronic flare today.  Chest xray ordered and pending. Start prednisone 20 mg tablets. Take 2 tablets by mouth once daily in the morning for 5 days. Refill provided for albuterol inhaler.  Orders: -     Albuterol Sulfate HFA; Inhale 1-2 puffs into the lungs every 6 (six) hours as needed for wheezing or shortness of breath.  Dispense: 18 g; Refill:  0  GAD (generalized anxiety disorder) Assessment & Plan: Controlled.  Remain off Zoloft 50 mg daily. Continue to monitor.    Tobacco abuse Assessment & Plan: He agrees to move forward with lung cancer screening. Referral placed.         Pleas Koch, NP

## 2022-11-10 NOTE — Assessment & Plan Note (Signed)
Possibly acute on chronic flare today.  Chest xray ordered and pending. Start prednisone 20 mg tablets. Take 2 tablets by mouth once daily in the morning for 5 days. Refill provided for albuterol inhaler.

## 2022-11-10 NOTE — Assessment & Plan Note (Signed)
Refill provided for Debrox drops.

## 2022-11-10 NOTE — Telephone Encounter (Addendum)
Sending note to [name], [facility] pool and will teams [facility] CMA.   [place] Night - Client Nonclinical Telephone Record  [facility]urse Client [facility] Night - Client Client Site [place] - Night Provider Alma Friendly - NP Contact Type Call Who Is Calling Patient / Member / Family / Caregiver Caller Name [place] Phone Number [phone] Patient Name Austin Hill Patient DOB 01/11/1966 Call Type Message Only Information Provided Reason for Call Request for General Office Information Initial Comment Caller wants to make sure he will be seen today as he may has bronchitis and he already spoke with an after hour nurse, please call back. Disp. Time Disposition Final User 11/10/2022 8:08:26 AM General Information Provided Yes Rhea Belton Call Closed By: Rhea Belton Transaction Date/Time: 11/10/2022 8:04:17 AM (ET

## 2022-11-10 NOTE — Assessment & Plan Note (Signed)
Controlled.  Remain off Zoloft 50 mg daily. Continue to monitor.

## 2022-11-10 NOTE — Assessment & Plan Note (Signed)
Controlled, borderline low, discussed this with patient today.  He is asymptomatic, has no concerns.  Continue losartan-HCTZ 100-12.5 mg for now. Consider discontinuation of HCTZ. CMP pending.

## 2022-11-10 NOTE — Assessment & Plan Note (Signed)
Declines influenza and Shingles vaccines. PSA due and pending. Colonoscopy overdue, referral placed to GI.  Discussed the importance of a healthy diet and regular exercise in order for weight loss, and to reduce the risk of further co-morbidity.  Exam stable. Labs pending.  Follow up in 1 year for repeat physical.

## 2022-11-10 NOTE — Assessment & Plan Note (Signed)
He agrees to move forward with lung cancer screening. Referral placed.

## 2022-11-10 NOTE — Assessment & Plan Note (Addendum)
Repeat A1C pending. Remain off treatment for now.  Urine microalbumin due and pending.  Await results.  Follow up in 3-6 months depending on A1c result

## 2022-11-10 NOTE — Telephone Encounter (Signed)
[place] Night - Client TELEPHONE ADVICE RECORD AccessNurse Patient Name: Austin Hill Gender: Male DOB: 12-20-1965 Age: 57 Y 11 M 19 D Return Phone Number: [phone] (Primary) Address: City/ State/ Zip: [place]  [address] Client [place] Night - Client Client Site [place] - Night Provider Alma Friendly - NP Contact Type Call Who Is Calling Patient / Member / Family / Caregiver Call Type Triage / Clinical Relationship To Patient Self Return Phone Number [phone] (Primary) Chief Complaint BREATHING - shortness of breath or sounds breathless Reason for Call Symptomatic / Request for [place] states he has an apt at [address] but is sick and wants to know if he can still come. He believes it is bronchitis. (Did test negative for covid.) States he is congested, stuffy nose, and short of breath but has no fever. Translation No Nurse Assessment Nurse: Rolin Barry, RN, Levada Dy Date/Time Eilene Ghazi Time): 11/10/2022 7:38:54 AM Confirm and document reason for call. If symptomatic, describe symptoms. ---Caller states he has an [address] but is sick and wants to know if he can still come in. Appt was for a physical. He believes it is bronchitis. (Did test negative for covid.) has a cough. Sx started yesterday. States he is congested, stuffy nose, and short of breath but has no fever. Does the patient have any new or worsening symptoms? ---Yes Will a triage be completed? ---Yes Related visit to physician within the last 2 weeks? ---No Does the PT have any chronic conditions? (i.e. diabetes, asthma, this includes High risk factors for pregnancy, etc.) ---Yes List chronic conditions. ---HTN Is this a behavioral health or substance abuse call? ---No Guidelines Guideline Title Affirmed Question Affirmed Notes Nurse Date/Time (Eastern Time) Cough -  Acute Productive [1] MILD difficulty breathing (e.g., minimal/no SOB at rest, SOB with Deaton, RN, Levada Dy 11/10/2022 7:40:09 AM PLEASE NOTE: All timestamps contained within this report are represented as [place] Standard Time. CONFIDENTIALTY NOTICE: This fax transmission is intended only for the addressee. It contains information that is legally privileged, confidential or otherwise protected from use or disclosure. If you are not the intended recipient, you are strictly prohibited from reviewing, disclosing, copying using or disseminating any of this information or taking any action in reliance on or regarding this information. If you have received this fax in error, please notify us immediately by telephone so that we can arrange for its return to [place]. Phone: [phone], Toll-Free: [phone], Fax: [phone] Page: 2 of 2 Call Id: [id number] Guidelines Guideline Title Affirmed Question Affirmed Notes Nurse Date/Time Eilene Ghazi Time) walking, pulse <100) AND [2] still present when not coughing Disp. Time Eilene Ghazi Time) Disposition Final User 11/10/2022 7:42:40 AM See HCP within 4 Hours (or PCP triage) Yes Deaton, RN, Levada Dy Final Disposition 11/10/2022 7:42:40 AM See HCP within 4 Hours (or PCP triage) Yes Deaton, RN, Cindee Lame Disagree/Comply Comply Caller Understands Yes PreDisposition Did not know what to do Care Advice Given Per Guideline SEE HCP (OR PCP TRIAGE) WITHIN 4 HOURS: * IF OFFICE WILL BE OPEN: You need to be seen within the next 3 or 4 hours. Call your doctor (or NP/PA) now or as soon as the office opens. * OFFICE: If patient sounds stable and not seriously ill, consult PCP (or follow your office policy) to see if patient can be seen NOW in office. CALL BACK IF: * You become worse CARE ADVICE given per Cough - Acute Productive (Adult) guideline.  Comments User: Saverio Danker, RN Date/Time Eilene Ghazi Time): 11/10/2022 7:44:10 AM Caller states that he thinks that he has  bronchitis. Advised to call the office when they open at 8am to confirm that he can use his 11:20 am physical appt for a sick appt. Call back with any worsening sx. Caller is a smoker. Referrals REFERRED TO PCP OFFIC

## 2022-11-10 NOTE — Assessment & Plan Note (Signed)
Negative influenza and Covid today.  Xray ordered and pending given exam. Start prednisone 40 mg daily x 5 days for potential COPD exacerbation.  Refill provided for albuterol inhaler.

## 2022-11-10 NOTE — Telephone Encounter (Signed)
Noted, will evaluate. 

## 2022-11-10 NOTE — Patient Instructions (Addendum)
Stop by the lab and xray prior to leaving today. I will notify you of your results once received.   You will either be contacted via phone regarding your referral to GI for the colonoscopy and for lung cancer screening, or you may receive a letter on your MyChart portal from our referral team with instructions for scheduling an appointment. Please let us know if you have not been contacted by anyone within two weeks.  Start prednisone 20 mg tablets. Take 2 tablets by mouth once daily in the morning for 5 days.  It was a pleasure to see you today!

## 2022-11-11 ENCOUNTER — Telehealth: Payer: Self-pay | Admitting: Primary Care

## 2022-11-11 ENCOUNTER — Other Ambulatory Visit: Payer: Self-pay | Admitting: Primary Care

## 2022-11-11 DIAGNOSIS — I10 Essential (primary) hypertension: Secondary | ICD-10-CM

## 2022-11-11 MED ORDER — LOSARTAN POTASSIUM 100 MG PO TABS: 100.0000 mg | ORAL_TABLET | Freq: Every day | ORAL | 3 refills | Status: DC

## 2022-11-11 NOTE — Telephone Encounter (Signed)
Patient returned call,I scheduled him for his bmp labs in two weeks

## 2022-11-11 NOTE — Telephone Encounter (Signed)
Noted.  See result note.  

## 2022-11-14 ENCOUNTER — Telehealth: Payer: Self-pay | Admitting: Primary Care

## 2022-11-14 NOTE — Telephone Encounter (Signed)
I spoke with pt and he said was seen on 11/10/22 pt notified as instructed by [name] about CXR report and pt voiced understanding. Pt said he has been taking the prednisone and using inhaler and not a lot better and at times the SOB is worse. Pt is having wheezing. Pt was advised by access nurse to go to ED but pt did not want to wait at ED. I offered pt an appt at [facility] this morning and pt was going to take appt and then changed his mind after pts wife encouraging him to go to ED. Pt asked me to call [facility] ED and I explained that they would triage pt when he arrives at ED but pt request me to call ED an d let them know he is coming. I called [facility] ED and gave above info.  Sending note to [name] who is out of office and Romilda Garret NP who is in office and [place] pool.    [place] Day - Client TELEPHONE ADVICE RECORD AccessNurse Patient Name: Austin Hill Gender: Male DOB: [birth year] Age: 57 Y 11 M 23 D Return Phone Number: [phone] (Primary) Address: City/ State/ Zip: [place]  [address] Client [facility] [place] Day - Client Client Site [place] - Day Provider Alma Friendly - NP Contact Type Call Who Is Calling Patient / Member / Family / Caregiver Call Type Triage / Clinical Relationship To Patient Self Return Phone Number [phone] (Primary) Chief Complaint BREATHING - shortness of breath or sounds breathless Reason for Call Symptomatic / Request for [place] states he is experiencing wheezing with shortness of breath. Translation No Nurse Assessment Nurse: Altamease Oiler, RN, Adriana Date/Time (Eastern Time): 11/14/2022 8:38:20 AM Confirm and document reason for call. If symptomatic, describe symptoms. ---pt states that he was seen on thursday d/t similar sx. was prescribed prednisone and an inhaler. not helping. pt is still reporting sob and  wheezing. sx onset last week. Does the patient have any new or worsening symptoms? ---Yes Will a triage be completed? ---Yes Related visit to physician within the last 2 weeks? ---Yes Does the PT have any chronic conditions? (i.e. diabetes, asthma, this includes High risk factors for pregnancy, etc.) ---Yes List chronic conditions. ---htn Is this a behavioral health or substance abuse call? ---No Guidelines Guideline Title Affirmed Question Affirmed Notes Nurse Date/Time (Eastern Time) Breathing Difficulty [1] MODERATE difficulty breathing (e.g., speaks in phrases, SOB even at rest, pulse 100-120) AND [2] NEW-onset or WORSE than normal Sarajane Marek 11/14/2022 8:39:52 AM PLEASE NOTE: All timestamps contained within this report are represented as [place] Standard Time. CONFIDENTIALTY NOTICE: This fax transmission is intended only for the addressee. It contains information that is legally privileged, confidential or otherwise protected from use or disclosure. If you are not the intended recipient, you are strictly prohibited from reviewing, disclosing, copying using or disseminating any of this information or taking any action in reliance on or regarding this information. If you have received this fax in error, please notify us immediately by telephone so that we can arrange for its return to [place]. Phone: [phone], Toll-Free: [phone], Fax: [phone] Page: 2 of 2 Call Id: [id number] [place]. Time Eilene Ghazi Time) Disposition Final User 11/14/2022 8:37:21 AM Send to Urgent Queue [facility] 11/14/2022 8:42:23 AM Go to ED Now Yes Altamease Oiler, RN, Adriana Final Disposition 11/14/2022 8:42:23 AM Go to ED Now Yes Altamease Oiler, RN, Darylene Price  Disagree/Comply Disagree Caller Understands Yes PreDisposition Call Doctor Care Advice Given Per Guideline GO TO ED NOW: CARE ADVICE given per Breathing Difficulty (Adult) guideline. Comments User: Kizzie Fantasia, RN Date/Time Eilene Ghazi  Time): 11/14/2022 8:49:07 AM spoke to Makai Dumond LPN, provided her with pt info Referrals GO TO [facility]

## 2022-11-14 NOTE — Telephone Encounter (Signed)
Noted. Agree that patient be evaluated.

## 2022-11-14 NOTE — Telephone Encounter (Signed)
Patient was seen on 11/10/2022. He was told to call in if his symptoms didn't get any better. He called in today stating that he is having a hard time breathing,and having some wheezing. I sent him to access nurse to be triaged.

## 2022-11-14 NOTE — Telephone Encounter (Signed)
Noted, will await ED notes.

## 2022-11-15 ENCOUNTER — Telehealth: Payer: Self-pay

## 2022-11-15 NOTE — Transitions of Care (Post Inpatient/ED Visit) (Signed)
   11/15/2022  Name: Leyton Magoon MRN: [id number] DOB: Jul 13, 1966  Today's TOC FU Call Status: Today's TOC FU Call Status:: Successful TOC FU Call Competed TOC FU Call Complete Date: 11/15/22  Transition Care Management Follow-up Telephone Call Date of Discharge: 11/14/22 Discharge Facility: Other Facility Name of Other Discharge Facility: [facility] Type of Discharge: Emergency Department Reason for ED Visit: Other: (dyspnea) How have you been since you were released from the hospital?: Better Any questions or concerns?: No  Items Reviewed: Did you receive and understand the discharge instructions provided?: Yes Medications obtained and verified?: Yes (Medications Reviewed) Any new allergies since your discharge?: No Dietary orders reviewed?: NA Do you have support at home?: Yes  Home Care and Equipment/Supplies: [place] Ordered?: NA Any new equipment or medical supplies ordered?: NA  Functional Questionnaire: Do you need assistance with bathing/showering or dressing?: No Do you need assistance with meal preparation?: No Do you need assistance with eating?: No Do you have difficulty maintaining continence: No Do you need assistance with getting out of bed/getting out of a chair/moving?: No Do you have difficulty managing or taking your medications?: No  Folllow up appointments reviewed: PCP Follow-up appointment confirmed?: NA Specialist Hospital Follow-up appointment confirmed?: Yes Date of Specialist follow-up appointment?: 01/02/23 Follow-Up Specialty Provider:: pulmonologist Do you need transportation to your follow-up appointment?: No Do you understand care options if your condition(s) worsen?: Yes-patient verbalized understanding    [place] LPN [place] Direct Dial [phone]

## 2022-11-17 ENCOUNTER — Telehealth: Payer: Self-pay

## 2022-11-17 NOTE — Transitions of Care (Post Inpatient/ED Visit) (Signed)
   11/17/2022  Name: Austin Hill MRN: [id number] DOB: [birth year]  Today's TOC FU Call Status: Today's TOC FU Call Status:: Successful TOC FU Call Competed TOC FU Call Complete Date: 11/17/22  Transition Care Management Follow-up Telephone Call Date of Discharge: 11/16/22 Discharge Facility: Other Facility Name of Other Discharge Facility: [facility] Type of Discharge: Emergency Department Reason for ED Visit: Other: (dyspnea) How have you been since you were released from the hospital?: Same Any questions or concerns?: No  Items Reviewed: Did you receive and understand the discharge instructions provided?: Yes Medications obtained and verified?: Yes (Medications Reviewed) Any new allergies since your discharge?: No Dietary orders reviewed?: NA Do you have support at home?: Yes  Home Care and Equipment/Supplies: [place] Ordered?: NA Any new equipment or medical supplies ordered?: NA  Functional Questionnaire: Do you need assistance with bathing/showering or dressing?: No Do you need assistance with meal preparation?: No Do you need assistance with eating?: No Do you have difficulty maintaining continence: No Do you need assistance with getting out of bed/getting out of a chair/moving?: No Do you have difficulty managing or taking your medications?: No  Folllow up appointments reviewed: PCP Follow-up appointment confirmed?: Yes Date of PCP follow-up appointment?: 11/28/22 Follow-up Provider: Dr Carlis [facility] Follow-up appointment confirmed?: NA Do you need transportation to your follow-up appointment?: No Do you understand care options if your condition(s) worsen?: Yes-patient verbalized understanding    [place] LPN [place] [phone]

## 2022-11-21 ENCOUNTER — Encounter: Payer: Self-pay | Admitting: *Deleted

## 2022-11-25 ENCOUNTER — Other Ambulatory Visit: Payer: 59

## 2022-11-25 ENCOUNTER — Inpatient Hospital Stay: Payer: 59 | Admitting: Primary Care

## 2022-11-28 ENCOUNTER — Inpatient Hospital Stay: Payer: 59 | Admitting: Primary Care

## 2022-11-28 ENCOUNTER — Encounter: Payer: Self-pay | Admitting: *Deleted

## 2022-12-02 ENCOUNTER — Inpatient Hospital Stay: Payer: 59 | Admitting: Primary Care

## 2023-01-05 ENCOUNTER — Other Ambulatory Visit: Payer: Self-pay | Admitting: Primary Care

## 2023-01-05 DIAGNOSIS — I10 Essential (primary) hypertension: Secondary | ICD-10-CM

## 2023-03-06 NOTE — Transitions of Care (Post Inpatient/ED Visit) (Signed)
   03/06/2023  Name: Austin Hill MRN: [id number] DOB: October 16, 1965  Opened in error    [place] Ganja LPN [facility] Nurse Health Advisor Direct Dial [phone]

## 2023-08-16 ENCOUNTER — Encounter: Payer: Self-pay | Admitting: Internal Medicine

## 2023-08-16 ENCOUNTER — Ambulatory Visit: Payer: 59 | Admitting: Internal Medicine

## 2023-08-16 VITALS — BP 158/98 | HR 92 | Temp 98.6°F | Ht 71.0 in | Wt 263.0 lb

## 2023-08-16 DIAGNOSIS — R0609 Other forms of dyspnea: Secondary | ICD-10-CM | POA: Diagnosis not present

## 2023-08-16 DIAGNOSIS — I10 Essential (primary) hypertension: Secondary | ICD-10-CM | POA: Diagnosis not present

## 2023-08-16 MED ORDER — LOSARTAN POTASSIUM-HCTZ 100-25 MG PO TABS: 1.0000 | ORAL_TABLET | Freq: Every day | ORAL | 3 refills | Status: DC

## 2023-08-16 NOTE — Patient Instructions (Signed)

## 2023-08-16 NOTE — Assessment & Plan Note (Signed)
Is being treated for lung disease but he rightly worries about CAD Will refer to cardiology for evaluation

## 2023-08-16 NOTE — Progress Notes (Signed)
Subjective:    Patient ID: Austin Hill, male    DOB: 01-Nov-1965, 57 y.o.   MRN: [id number]  HPI Here due to elevated blood pressure  Hasn't felt right for a month Recent family reunion in [place] everyone got sick after Some headache Checked BP 196/124---yesterday upon getting home from work Has used 2 cuffs--and they were the same Wife used it also--just mildly elevated He thinks it is big enough  No chest pain Stable SOB---gets easily winded (got stiolto from pulmonary)  Occasionally forgets his losartan--but mostly remembers  Current Outpatient Medications on File Prior to Visit  Medication Sig Dispense Refill   albuterol (VENTOLIN HFA) 108 (90 Base) MCG/ACT inhaler Inhale 1-2 puffs into the lungs every 6 (six) hours as needed for wheezing or shortness of breath. 18 g 0   carbamide peroxide (DEBROX) 6.5 % OTIC solution Place 5 drops into both ears daily as needed. 15 mL 0   losartan (COZAAR) 100 MG tablet Take 1 tablet (100 mg total) by mouth daily. for blood pressure. 90 tablet 3   sildenafil (REVATIO) 20 MG tablet Take 2-5 tablets by mouth 30 minutes prior to intercourse 50 tablet 0   No current facility-administered medications on file prior to visit.    No Known Allergies  Past Medical History:  Diagnosis Date   Acute right-sided low back pain without sciatica 02/04/2020   Hypertension     Past Surgical History:  Procedure Laterality Date   COLONOSCOPY W/ BIOPSIES  about 10 years ago   polyps   COLONOSCOPY WITH PROPOFOL N/A 10/26/2017   Procedure: COLONOSCOPY WITH PROPOFOL;  Surgeon: Wyline Mood, MD;  Location: [facility] ENDOSCOPY;  Service: Gastroenterology;  Laterality: N/A;   WRIST SURGERY Left 68-62 years old   compound fracture    Family History  Problem Relation Age of Onset   Aneurysm Father        Abdominal   Aneurysm Paternal Grandfather        Abdominal     Social History   Socioeconomic History   Marital status: Divorced     Spouse name: Not on file   Number of children: Not on file   Years of education: Not on file   Highest education level: Not on file  Occupational History   Not on file  Tobacco Use   Smoking status: Every Day    Current packs/day: 1.00    Average packs/day: 1 pack/day for 25.0 years (25.0 ttl pk-yrs)    Types: Cigarettes   Smokeless tobacco: Never  Substance and Sexual Activity   Alcohol use: Yes    Alcohol/week: 0.0 standard drinks of alcohol    Comment: every couple of days   Drug use: No   Sexual activity: Not on file  Other Topics Concern   Not on file  Social History Narrative   From [place] area.   Single, has girlfriend.   Three children. His son lives locally.   Enjoy camping, golfing, swimming.   Social Determinants of Health   Financial Resource Strain: Not on file  Food Insecurity: Not on file  Transportation Needs: Not on file  Physical Activity: Not on file  Stress: Not on file  Social Connections: Not on file  Intimate Partner Violence: Not on file   Review of Systems No history of gout Chronic sleep problems Uses salt only occasionally  Has gained 15#    Objective:   Physical Exam Constitutional:      Appearance: Normal appearance.  Cardiovascular:  Rate and Rhythm: Normal rate and regular rhythm.     Heart sounds: No murmur heard.    No gallop.  Pulmonary:     Effort: Pulmonary effort is normal.     Breath sounds: Normal breath sounds. No wheezing or rales.  Musculoskeletal:     Cervical back: Neck supple.     Right lower leg: No edema.     Left lower leg: No edema.  Lymphadenopathy:     Cervical: No cervical adenopathy.  Neurological:     Mental Status: He is alert.  Psychiatric:        Mood and Affect: Mood normal.        Behavior: Behavior normal.            Assessment & Plan:

## 2023-08-16 NOTE — Assessment & Plan Note (Signed)
BP Readings from Last 3 Encounters:  08/16/23 (!) 138/100  11/10/22 102/68  06/20/22 100/70   Repeat 158/98 Will give DASH info Active at work--may want to add leisure time exercise Will add hydrochlorothiazide 25 to his losartan 100 Follow up 1 month with Jae Dire

## 2023-09-08 ENCOUNTER — Encounter: Payer: Self-pay | Admitting: Primary Care

## 2023-09-08 ENCOUNTER — Ambulatory Visit (INDEPENDENT_AMBULATORY_CARE_PROVIDER_SITE_OTHER): Payer: 59 | Admitting: Primary Care

## 2023-09-08 ENCOUNTER — Other Ambulatory Visit: Payer: Self-pay | Admitting: Primary Care

## 2023-09-08 VITALS — BP 116/78 | HR 101 | Temp 98.1°F | Ht 71.0 in | Wt 260.0 lb

## 2023-09-08 DIAGNOSIS — I10 Essential (primary) hypertension: Secondary | ICD-10-CM

## 2023-09-08 DIAGNOSIS — N529 Male erectile dysfunction, unspecified: Secondary | ICD-10-CM

## 2023-09-08 LAB — BASIC METABOLIC PANEL
BUN: 27 mg/dL — ABNORMAL HIGH (ref 6–23)
CO2: 29 meq/L (ref 19–32)
Calcium: 10.3 mg/dL (ref 8.4–10.5)
Chloride: 93 meq/L — ABNORMAL LOW (ref 96–112)
Creatinine, Ser: 1.34 mg/dL (ref 0.40–1.50)
GFR: 58.28 mL/min — ABNORMAL LOW (ref >60.00)
Glucose, Bld: 132 mg/dL — ABNORMAL HIGH (ref 70–99)
Potassium: 3.9 meq/L (ref 3.5–5.1)
Sodium: 130 meq/L — ABNORMAL LOW (ref 135–145)

## 2023-09-08 MED ORDER — SILDENAFIL CITRATE 20 MG PO TABS: ORAL_TABLET | ORAL | 0 refills | Status: AC

## 2023-09-08 MED ORDER — AMLODIPINE BESYLATE 5 MG PO TABS: 5.0000 mg | ORAL_TABLET | Freq: Every day | ORAL | 0 refills | Status: DC

## 2023-09-08 NOTE — Progress Notes (Signed)
Subjective:    Patient ID: Austin Hill, male    DOB: 12/30/1965, 57 y.o.   MRN: [id number]  HPI  Austin Hill is a very pleasant 57 y.o. male who presents today   Evaluated by Dr. Alphonsus Sias on 08/16/2023 for 1 month history of "not feeling well".  Symptoms including headaches, shortness of breath.  Home blood pressure readings were elevated, 196/124 prior to his visit.  Blood pressure was above goal during this visit, and he also admitted to inconsistent administration of his losartan.  His losartan 100 mg was continued and HCTZ 25 mg was added to his regimen.  He was also referred to cardiology for exertional dyspnea.  Since his last visit he is compliant to losartan-hydrochlorothiazide 100-25 mg daily. He has an appointment scheduled with cardiology in February 2025. His symptoms have improved from last visit. He denies headaches, dizziness, chest pain. His shortness of breath has improved.   BP Readings from Last 3 Encounters:  09/08/23 116/78  08/16/23 (!) 158/98  11/10/22 102/68      Review of Systems  Respiratory:  Negative for shortness of breath.   Cardiovascular:  Negative for chest pain.  Neurological:  Negative for dizziness and headaches.         Past Medical History:  Diagnosis Date   Acute right-sided low back pain without sciatica 02/04/2020   Hypertension     Social History   Socioeconomic History   Marital status: Divorced    Spouse name: Not on file   Number of children: Not on file   Years of education: Not on file   Highest education level: Not on file  Occupational History   Not on file  Tobacco Use   Smoking status: Every Day    Current packs/day: 1.00    Average packs/day: 1 pack/day for 25.0 years (25.0 ttl pk-yrs)    Types: Cigarettes   Smokeless tobacco: Never  Substance and Sexual Activity   Alcohol use: Yes    Alcohol/week: 0.0 standard drinks of alcohol    Comment: every couple of days   Drug use: No   Sexual  activity: Not on file  Other Topics Concern   Not on file  Social History Narrative   From [place] area.   Single, has girlfriend.   Three children. His son lives locally.   Enjoy camping, golfing, swimming.   Social Determinants of Health   Financial Resource Strain: Not on file  Food Insecurity: Not on file  Transportation Needs: Not on file  Physical Activity: Not on file  Stress: Not on file  Social Connections: Not on file  Intimate Partner Violence: Not on file    Past Surgical History:  Procedure Laterality Date   COLONOSCOPY W/ BIOPSIES  about 10 years ago   polyps   COLONOSCOPY WITH PROPOFOL N/A 10/26/2017   Procedure: COLONOSCOPY WITH PROPOFOL;  Surgeon: Wyline Mood, MD;  Location: [facility] ENDOSCOPY;  Service: Gastroenterology;  Laterality: N/A;   WRIST SURGERY Left 66-56 years old   compound fracture    Family History  Problem Relation Age of Onset   Aneurysm Father        Abdominal   Aneurysm Paternal Grandfather        Abdominal     No Known Allergies  Current Outpatient Medications on File Prior to Visit  Medication Sig Dispense Refill   albuterol (VENTOLIN HFA) 108 (90 Base) MCG/ACT inhaler Inhale 1-2 puffs into the lungs every 6 (six) hours as needed for  wheezing or shortness of breath. 18 g 0   losartan-hydrochlorothiazide (HYZAAR) 100-25 MG tablet Take 1 tablet by mouth daily. 90 tablet 3   carbamide peroxide (DEBROX) 6.5 % OTIC solution Place 5 drops into both ears daily as needed. (Patient not taking: Reported on 09/08/2023) 15 mL 0   No current facility-administered medications on file prior to visit.    BP 116/78   Pulse (!) 101   Temp 98.1 F (36.7 C) (Temporal)   Ht 5\' 11"  (1.803 m)   Wt 260 lb (117.9 kg)   SpO2 95%   BMI 36.26 kg/m  Objective:   Physical Exam Cardiovascular:     Rate and Rhythm: Normal rate and regular rhythm.  Pulmonary:     Effort: Pulmonary effort is normal.     Breath sounds: Normal breath sounds.   Musculoskeletal:     Cervical back: Neck supple.  Skin:    General: Skin is warm and dry.  Neurological:     Mental Status: He is alert and oriented to person, place, and time.  Psychiatric:        Mood and Affect: Mood normal.           Assessment & Plan:  Essential hypertension Assessment & Plan: Improved and controlled!  Continue losartan-hydrochlorothiazide 100-25 mg daily. BMP pending.  Orders: -     Basic metabolic panel  Erectile dysfunction, unspecified erectile dysfunction type -     Sildenafil Citrate; Take 2-5 tablets by mouth 30 minutes prior to intercourse  Dispense: 50 tablet; Refill: 0        Doreene Nest, NP

## 2023-09-08 NOTE — Assessment & Plan Note (Signed)
Improved and controlled!  Continue losartan-hydrochlorothiazide 100-25 mg daily. BMP pending.

## 2023-09-08 NOTE — Patient Instructions (Signed)
Stop by the lab prior to leaving today. I will notify you of your results once received.   Continue taking losartan-hydrochlorothiazide 100-25 milligrams daily.  Schedule your physical for February 2025.  It was a pleasure to see you today!

## 2023-09-08 NOTE — Addendum Note (Signed)
Addended by: Vincenza Hews on: 09/08/2023 08:43 AM   Modules accepted: Orders

## 2023-09-22 ENCOUNTER — Telehealth: Payer: Self-pay

## 2023-09-22 NOTE — Telephone Encounter (Signed)
Per chart review patient called back to scheduled appt.

## 2023-09-22 NOTE — Telephone Encounter (Signed)
Copied from CRM [id number]. Topic: Clinical - Medication Question >> Sep 22, 2023  9:10 AM Austin Hill wrote: Reason for CRM: Patient called in stating Dr. Chestine Spore prescribed medication for his arthritis years ago and is wanting a prescription due to back really hurting and being unable to sleep / please call [phone]

## 2023-09-22 NOTE — Telephone Encounter (Signed)
Unable to reach patient. Left voicemail to return call to our office.   If patient returns call, he needs an appointment to restart medication.

## 2023-09-26 ENCOUNTER — Encounter: Payer: Self-pay | Admitting: Primary Care

## 2023-09-26 ENCOUNTER — Ambulatory Visit (INDEPENDENT_AMBULATORY_CARE_PROVIDER_SITE_OTHER): Payer: 59 | Admitting: Primary Care

## 2023-09-26 VITALS — BP 104/64 | HR 100 | Temp 98.2°F | Ht 71.0 in | Wt 260.2 lb

## 2023-09-26 DIAGNOSIS — G8929 Other chronic pain: Secondary | ICD-10-CM

## 2023-09-26 DIAGNOSIS — M545 Low back pain, unspecified: Secondary | ICD-10-CM | POA: Diagnosis not present

## 2023-09-26 DIAGNOSIS — I10 Essential (primary) hypertension: Secondary | ICD-10-CM

## 2023-09-26 LAB — BASIC METABOLIC PANEL
BUN: 18 mg/dL (ref 6–23)
CO2: 24 meq/L (ref 19–32)
Calcium: 9.7 mg/dL (ref 8.4–10.5)
Chloride: 101 meq/L (ref 96–112)
Creatinine, Ser: 1.06 mg/dL (ref 0.40–1.50)
GFR: 77.18 mL/min (ref >60.00)
Glucose, Bld: 198 mg/dL — ABNORMAL HIGH (ref 70–99)
Potassium: 4.4 meq/L (ref 3.5–5.1)
Sodium: 133 meq/L — ABNORMAL LOW (ref 135–145)

## 2023-09-26 MED ORDER — CYCLOBENZAPRINE HCL 5 MG PO TABS: 5.0000 mg | ORAL_TABLET | Freq: Every evening | ORAL | 0 refills | Status: AC | PRN

## 2023-09-26 MED ORDER — NAPROXEN 500 MG PO TABS: 500.0000 mg | ORAL_TABLET | Freq: Two times a day (BID) | ORAL | 0 refills | Status: DC | PRN

## 2023-09-26 NOTE — Assessment & Plan Note (Signed)
Controlled.  Continue amlodipine 5 mg daily and losartan 100 mg daily. BMP pending.

## 2023-09-26 NOTE — Patient Instructions (Signed)
You may take naproxen 500 mg tablets twice daily with a meal as needed for back pain.  You may take cyclobenzaprine muscle relaxer at bedtime for muscle tightness and back pain.  This may cause drowsiness.  Please update me as discussed if your pain continues.  Stop by the lab prior to leaving today. I will notify you of your results once received.   It was a pleasure to see you today!

## 2023-09-26 NOTE — Progress Notes (Signed)
Subjective:    Patient ID: Austin Hill, male    DOB: 03/26/1966, 57 y.o.   MRN: [id number]  HPI  Austin Hill is a very pleasant 57 y.o. male with a history of hypertension, chronic bronchitis, type 2 diabetes, tobacco use, sleep disturbance, GAD who presents today to discuss back pain and follow up of hypertension.   1) Chronic Back Pain: Chronic to the lower mid lumbar spine for years. His pain is intermittent, most recently his pain began 3 weeks ago, his pain is keeping him up at night, describes his pain as achy and tense. His pain is worse when getting out of bed, moving from a chair after sitting, bending forward. The colder weather has caused increased pain.  He's been taking Ibuprofen and Extra Strength Tylenol 2-3 times daily for the last few weeks which helps temporarily. He denies increased physical activity, injury/trauma. He is active during the day, works on elevators, has historically carried heavy tools and climbing stairs.   He underwent plain films of the lumbar spine in May 2021 which showed mild arthritis at L5-S1 without disc space narrowing.   He denies radiation of pain, numbness/tingling, loss of bowel/bladder control. Today he's feeling better than he has in a few weeks.   2) Essential Hypertension: Currently managed on amlodipine 5 mg daily and losartan 100 mg daily.  During his visit on 09/08/2023 his blood pressure regimen was changed with the addition of hydrochlorothiazide 25 mg daily.  He has a history of hyponatremia for which is the reason hydrochlorothiazide was originally discontinued.  During his last visit his BMP was checked which revealed hyponatremia so his hydrochlorothiazide was discontinued again.  Losartan 100 mg daily was continued and amlodipine 5 mg was added.  Since his last visit he is compliant to losartan 100 mg daily and amlodipine 5 mg daily.  He is due for repeat BMP today.  BP Readings from Last 3 Encounters:  09/26/23  104/64  09/08/23 116/78  08/16/23 (!) 158/98      Review of Systems  Respiratory:  Negative for shortness of breath.   Cardiovascular:  Negative for chest pain.  Musculoskeletal:  Positive for arthralgias and back pain.  Neurological:  Negative for weakness and numbness.         Past Medical History:  Diagnosis Date   Acute right-sided low back pain without sciatica 02/04/2020   Hypertension     Social History   Socioeconomic History   Marital status: Divorced    Spouse name: Not on file   Number of children: Not on file   Years of education: Not on file   Highest education level: Not on file  Occupational History   Not on file  Tobacco Use   Smoking status: Every Day    Current packs/day: 1.00    Average packs/day: 1 pack/day for 25.0 years (25.0 ttl pk-yrs)    Types: Cigarettes   Smokeless tobacco: Never  Substance and Sexual Activity   Alcohol use: Yes    Alcohol/week: 0.0 standard drinks of alcohol    Comment: every couple of days   Drug use: No   Sexual activity: Not on file  Other Topics Concern   Not on file  Social History Narrative   From [place] area.   Single, has girlfriend.   Three children. His son lives locally.   Enjoy camping, golfing, swimming.   Social Drivers of Corporate investment banker Strain: Not on file  Food Insecurity: Not  on file  Transportation Needs: Not on file  Physical Activity: Not on file  Stress: Not on file  Social Connections: Not on file  Intimate Partner Violence: Not on file    Past Surgical History:  Procedure Laterality Date   COLONOSCOPY W/ BIOPSIES  about 10 years ago   polyps   COLONOSCOPY WITH PROPOFOL N/A 10/26/2017   Procedure: COLONOSCOPY WITH PROPOFOL;  Surgeon: Wyline Mood, MD;  Location: [facility] ENDOSCOPY;  Service: Gastroenterology;  Laterality: N/A;   WRIST SURGERY Left 45-71 years old   compound fracture    Family History  Problem Relation Age of Onset   Aneurysm Father        Abdominal    Aneurysm Paternal Grandfather        Abdominal     No Known Allergies  Current Outpatient Medications on File Prior to Visit  Medication Sig Dispense Refill   albuterol (VENTOLIN HFA) 108 (90 Base) MCG/ACT inhaler Inhale 1-2 puffs into the lungs every 6 (six) hours as needed for wheezing or shortness of breath. 18 g 0   amLODipine (NORVASC) 5 MG tablet Take 1 tablet (5 mg total) by mouth daily. for blood pressure. 90 tablet 0   losartan (COZAAR) 100 MG tablet Take 100 mg by mouth daily.     sildenafil (REVATIO) 20 MG tablet Take 2-5 tablets by mouth 30 minutes prior to intercourse 50 tablet 0   No current facility-administered medications on file prior to visit.    BP 104/64 (BP Location: Left Arm, Patient Position: Sitting, Cuff Size: Large)   Pulse 100   Temp 98.2 F (36.8 C) (Temporal)   Ht 5\' 11"  (1.803 m)   Wt 260 lb 3.2 oz (118 kg)   SpO2 98%   BMI 36.29 kg/m  Objective:   Physical Exam Cardiovascular:     Rate and Rhythm: Normal rate and regular rhythm.  Pulmonary:     Effort: Pulmonary effort is normal.     Breath sounds: Normal breath sounds.  Musculoskeletal:     Cervical back: Neck supple.     Lumbar back: No tenderness or bony tenderness. Normal range of motion. Negative right straight leg raise test and negative left straight leg raise test.       Back:  Skin:    General: Skin is warm and dry.  Neurological:     Mental Status: He is alert and oriented to person, place, and time.  Psychiatric:        Mood and Affect: Mood normal.           Assessment & Plan:  Chronic midline low back pain without sciatica Assessment & Plan: HPI and exam today representative of osteoarthritis.  No alarm signs.  Reviewed x-rays of lumbar spine from 2021.  Discussed that recurrent use of ibuprofen/Tylenol would not be desirable.  For now, we will trial naproxen 500 mg twice daily x 1-2 weeks as this was historically effective.  Also start cyclobenzaprine 5 mg at  bedtime.  Drowsiness precaution provided.  Consider orthopedic referral for continued symptoms/recurrent use of NSAIDs.  He will update.  Orders: -     Naproxen; Take 1 tablet (500 mg total) by mouth 2 (two) times daily as needed for moderate pain (pain score 4-6).  Dispense: 28 tablet; Refill: 0 -     Cyclobenzaprine HCl; Take 1 tablet (5 mg total) by mouth at bedtime as needed for muscle spasms.  Dispense: 14 tablet; Refill: 0  Essential hypertension Assessment & Plan: Controlled.  Continue amlodipine 5 mg daily and losartan 100 mg daily. BMP pending.  Orders: -     Basic metabolic panel        Doreene Nest, NP

## 2023-09-26 NOTE — Assessment & Plan Note (Addendum)
HPI and exam today representative of osteoarthritis.  No alarm signs.  Reviewed x-rays of lumbar spine from 2021.  Discussed that recurrent use of ibuprofen/Tylenol would not be desirable.  For now, we will trial naproxen 500 mg twice daily x 1-2 weeks as this was historically effective.  Also start cyclobenzaprine 5 mg at bedtime.  Drowsiness precaution provided.  Consider orthopedic referral for continued symptoms/recurrent use of NSAIDs.  He will update.

## 2023-09-29 ENCOUNTER — Other Ambulatory Visit: Payer: 59

## 2023-11-07 ENCOUNTER — Encounter: Payer: Self-pay | Admitting: Cardiology

## 2023-11-07 ENCOUNTER — Ambulatory Visit: Payer: 59 | Attending: Cardiology | Admitting: Cardiology

## 2023-11-07 VITALS — BP 132/88 | HR 86 | Ht 71.0 in | Wt 260.8 lb

## 2023-11-07 DIAGNOSIS — R0609 Other forms of dyspnea: Secondary | ICD-10-CM

## 2023-11-07 DIAGNOSIS — Z8249 Family history of ischemic heart disease and other diseases of the circulatory system: Secondary | ICD-10-CM | POA: Diagnosis not present

## 2023-11-07 DIAGNOSIS — R072 Precordial pain: Secondary | ICD-10-CM

## 2023-11-07 DIAGNOSIS — IMO0001 Reserved for inherently not codable concepts without codable children: Secondary | ICD-10-CM

## 2023-11-07 DIAGNOSIS — F17200 Nicotine dependence, unspecified, uncomplicated: Secondary | ICD-10-CM

## 2023-11-07 DIAGNOSIS — I10 Essential (primary) hypertension: Secondary | ICD-10-CM | POA: Diagnosis not present

## 2023-11-07 MED ORDER — METOPROLOL TARTRATE 100 MG PO TABS: 100.0000 mg | ORAL_TABLET | Freq: Once | ORAL | 0 refills | Status: DC

## 2023-11-07 NOTE — Progress Notes (Signed)
 Cardiology Office Note:    Date:  11/07/2023   ID:  Lonni Myrna Potters, DOB 09/03/1966, MRN [id number]  PCP:  Gretta Comer POUR, NP   [facility] Providers Cardiologist:  Redell Cave, MD     Referring MD: Gretta Comer POUR, NP   Chief Complaint  Patient presents with   New Patient (Initial Visit)    Referred for cardiac evaluation of exertional dyspnea.     Austin Hill is a 58 y.o. male who is being seen today for the evaluation of dyspnea on exertion at the request of Gretta Comer POUR, NP.   History of Present Illness:    Austin Hill is a 58 y.o. male with a hx of hypertension, current smoker x 30+ years who presents due to dyspnea on exertion.  Endorses dyspnea on exertion over the past year or so.  Has occasional nonspecific chest pain.  Denies edema.  Father died secondary to an abdominal aortic aneurysm.  States smoking all his life, [place]  Wants to get checked out due to family history.  Past Medical History:  Diagnosis Date   Acute right-sided low back pain without sciatica 02/04/2020   Hypertension     Past Surgical History:  Procedure Laterality Date   COLONOSCOPY W/ BIOPSIES  about 10 years ago   polyps   COLONOSCOPY WITH PROPOFOL  N/A 10/26/2017   Procedure: COLONOSCOPY WITH PROPOFOL ;  Surgeon: Therisa Bi, MD;  Location: [facility] ENDOSCOPY;  Service: Gastroenterology;  Laterality: N/A;   WRIST SURGERY Left 43-102 years old   compound fracture    Current Medications: Current Meds  Medication Sig   albuterol  (VENTOLIN  HFA) 108 (90 Base) MCG/ACT inhaler Inhale 1-2 puffs into the lungs every 6 (six) hours as needed for wheezing or shortness of breath.   amLODipine  (NORVASC ) 5 MG tablet Take 1 tablet (5 mg total) by mouth daily. for blood pressure.   cyclobenzaprine  (FLEXERIL ) 5 MG tablet Take 1 tablet (5 mg total) by mouth at bedtime as needed for muscle spasms.   losartan  (COZAAR ) 100 MG tablet Take 100  mg by mouth daily.   metoprolol  tartrate (LOPRESSOR ) 100 MG tablet Take 1 tablet (100 mg total) by mouth once for 1 dose. TAKE TWO HOURS PRIOR TO CARDIAC CTA   naproxen  (NAPROSYN ) 500 MG tablet Take 1 tablet (500 mg total) by mouth 2 (two) times daily as needed for moderate pain (pain score 4-6).   sildenafil  (REVATIO ) 20 MG tablet Take 2-5 tablets by mouth 30 minutes prior to intercourse     Allergies:   Patient has no known allergies.   Social History   Socioeconomic History   Marital status: Divorced    Spouse name: Not on file   Number of children: Not on file   Years of education: Not on file   Highest education level: Not on file  Occupational History   Not on file  Tobacco Use   Smoking status: Every Day    Current packs/day: 1.00    Average packs/day: 1 pack/day for 25.0 years (25.0 ttl pk-yrs)    Types: Cigarettes   Smokeless tobacco: Never  Vaping Use   Vaping status: Never Used  Substance and Sexual Activity   Alcohol use: Yes    Alcohol/week: 0.0 standard drinks of alcohol    Comment: every couple of days   Drug use: No   Sexual activity: Not on file  Other Topics Concern   Not on file  Social History  Narrative   From [place] area.   Single, has girlfriend.   Three children. His son lives locally.   Enjoy camping, golfing, swimming.   Social Drivers of Corporate Investment Banker Strain: Not on file  Food Insecurity: Not on file  Transportation Needs: Not on file  Physical Activity: Not on file  Stress: Not on file  Social Connections: Not on file     Family History: The patient's family history includes Aneurysm in his father and paternal grandfather.  ROS:   Please see the history of present illness.     All other systems reviewed and are negative.  EKGs/Labs/Other Studies Reviewed:    The following studies were reviewed today:  EKG Interpretation Date/Time:  Tuesday November 07 2023 08:52:54 EST Ventricular Rate:  86 PR  Interval:  140 QRS Duration:  88 QT Interval:  372 QTC Calculation: 445 R Axis:   65  Text Interpretation: Normal sinus rhythm Normal ECG Confirmed by Darliss Rogue ([id number]) on 11/07/2023 9:01:47 AM    Recent Labs: 11/10/2022: ALT 46 09/26/2023: BUN 18; Creatinine, Ser 1.06; Potassium 4.4; Sodium 133  Recent Lipid Panel    Component Value Date/Time   CHOL 148 11/10/2022 1145   TRIG 67.0 11/10/2022 1145   HDL 46.20 11/10/2022 1145   CHOLHDL 3 11/10/2022 1145   VLDL 13.4 11/10/2022 1145   LDLCALC 89 11/10/2022 1145     Risk Assessment/Calculations:             Physical Exam:    VS:  BP 132/88 (BP Location: Left Arm, Patient Position: Sitting, Cuff Size: Large)   Pulse 86   Ht 5' 11 (1.803 m)   Wt 260 lb 12.8 oz (118.3 kg)   SpO2 96%   BMI 36.37 kg/m     Wt Readings from Last 3 Encounters:  11/07/23 260 lb 12.8 oz (118.3 kg)  09/26/23 260 lb 3.2 oz (118 kg)  09/08/23 260 lb (117.9 kg)     GEN:  Well nourished, well developed in no acute distress HEENT: Normal NECK: No JVD; No carotid bruits CARDIAC: RRR, no murmurs, rubs, gallops RESPIRATORY:  Clear to auscultation without rales, wheezing or rhonchi  ABDOMEN: Soft, non-tender, non-distended MUSCULOSKELETAL:  No edema; No deformity  SKIN: Warm and dry NEUROLOGIC:  Alert and oriented x 3 PSYCHIATRIC:  Normal affect   ASSESSMENT:    1. Dyspnea on exertion   2. Primary hypertension   3. Smoking   4. Family history of abdominal aortic aneurysm (AAA)   5. Precordial pain    PLAN:    In order of problems listed above:  Dyspnea on exertion, occasional chest pain this could be an anginal equivalent.  Get echocardiogram, get coronary CTA. Hypertension, BP controlled.  Continue losartan  100 mg daily, Norvasc  5 mg daily. Current smoker, smoking cessation advised. Family history of abdominal aortic aneurysm, patient is a smoker.  Get abdominal aortic ultrasound.  Follow-up after testing.       Medication  Adjustments/Labs and Tests Ordered: Current medicines are reviewed at length with the patient today.  Concerns regarding medicines are outlined above.  Orders Placed This Encounter  Procedures   CT CORONARY MORPH W/CTA COR W/SCORE W/CA W/CM &/OR WO/CM   Basic Metabolic Panel (BMET)   EKG 12-Lead   ECHOCARDIOGRAM COMPLETE   VAS US  AAA DUPLEX   Meds ordered this encounter  Medications   metoprolol  tartrate (LOPRESSOR ) 100 MG tablet    Sig: Take 1 tablet (100 mg total) by mouth  once for 1 dose. TAKE TWO HOURS PRIOR TO CARDIAC CTA    Dispense:  1 tablet    Refill:  0    Patient Instructions  Medication Instructions:   Your physician recommends that you continue on your current medications as directed. Please refer to the Current Medication list given to you today.  *If you need a refill on your cardiac medications before your next appointment, please call your pharmacy*   Lab Work:  Your physician recommends you have labs - BMP   If you have labs (blood work) drawn today and your tests are completely normal, you will receive your results only by: MyChart Message (if you have MyChart) OR A paper copy in the mail If you have any lab test that is abnormal or we need to change your treatment, we will call you to review the results.   Testing/Procedures:  Your physician has requested that you have an echocardiogram. Echocardiography is a painless test that uses sound waves to create images of your heart. It provides your doctor with information about the size and shape of your heart and how well your heart's chambers and valves are working. This procedure takes approximately one hour. There are no restrictions for this procedure. Please do NOT wear cologne, perfume, aftershave, or lotions (deodorant is allowed). Please arrive 15 minutes prior to your appointment time.   2. Your physician has requested that you have an abdominal aorta duplex. During this test, an ultrasound is used  to evaluate the aorta. Allow 30 minutes for this exam. Do not eat after midnight the day before and avoid carbonated beverages.  Please note: We ask at that you not bring children with you during ultrasound (echo/ vascular) testing. Due to room size and safety concerns, children are not allowed in the ultrasound rooms during exams. Our front office staff cannot provide observation of children in our lobby area while testing is being conducted. An adult accompanying a patient to their appointment will only be allowed in the ultrasound room at the discretion of the ultrasound technician under special circumstances. We apologize for any inconvenience.     Your cardiac CT will be scheduled at one of the below locations:   [facility] [address] [place], [place] [address] [phone]  OR   [facility] [address] [place], [place] [address] [phone]  If scheduled at [facility] or [facility], please arrive 15 mins early for check-in and test prep.  There is spacious parking and easy access to the radiology department from the [facility] Heart and Vascular entrance. Please enter here and check-in with the desk attendant.   If scheduled at [facility], please arrive 30 minutes early for check-in and test prep.  Please follow these instructions carefully (unless otherwise directed):  An IV will be required for this test and Nitroglycerin  will be given.  Hold all erectile dysfunction medications at least 3 days (72 hrs) prior to test. (Ie viagra , cialis, sildenafil , tadalafil, etc)   On the Night Before the Test: Be sure to Drink plenty of water. Do not consume any caffeinated/decaffeinated beverages or chocolate 12 hours prior to your test. Do not take any antihistamines 12 hours prior to your test.  On the Day of the Test: Drink plenty of water until 1 hour  prior to the test. Do not eat any food 1 hour prior to test. You may take your regular medications  prior to the test.  Take metoprolol  (Lopressor ) two hours prior to test. Patients who wear a continuous glucose monitor MUST remove the device prior to scanning.  After the Test: Drink plenty of water. After receiving IV contrast, you may experience a mild flushed feeling. This is normal. On occasion, you may experience a mild rash up to 24 hours after the test. This is not dangerous. If this occurs, you can take Benadryl 25 mg, Zyrtec, Claritin, or Allegra and increase your fluid intake. (Patients taking Tikosyn should avoid Benadryl, and may take Zyrtec, Claritin, or Allegra) If you experience trouble breathing, this can be serious. If it is severe call 911 IMMEDIATELY. If it is mild, please call our office.  We will call to schedule your test 2-4 weeks out understanding that some insurance companies will need an authorization prior to the service being performed.   For more information and frequently asked questions, please visit our website : [email or web address]  For non-scheduling related questions, please contact the cardiac imaging nurse navigator should you have any questions/concerns: Cardiac Imaging Nurse Navigators Direct Office Dial: [phone]   For scheduling needs, including cancellations and rescheduling, please call [place], [phone].  Follow-Up: At [facility], you and your health needs are our priority.  As part of our continuing mission to provide you with exceptional heart care, we have created designated Provider Care Teams.  These Care Teams include your primary Cardiologist (physician) and Advanced Practice Providers (APPs -  Physician Assistants and Nurse Practitioners) who all work together to provide you with the care you need, when you need it.  We recommend signing up for the patient portal called MyChart.  Sign up information is  provided on this After Visit Summary.  MyChart is used to connect with patients for Virtual Visits (Telemedicine).  Patients are able to view lab/test results, encounter notes, upcoming appointments, etc.  Non-urgent messages can be sent to your provider as well.   To learn more about what you can do with MyChart, go to [email or web address].    Your next appointment:   2 - 3  month(s)  Provider:   You may see Redell Cave, MD or one of the following Advanced Practice Providers on your designated Care Team:   Lonni Meager, NP Bernardino Bring, PA-C Cadence Franchester, PA-C Tylene Lunch, NP Barnie Hila, NP     Signed, Redell Cave, MD  11/07/2023 10:07 AM    [facility]

## 2023-11-07 NOTE — Patient Instructions (Addendum)
Medication Instructions:   Your physician recommends that you continue on your current medications as directed. Please refer to the Current Medication list given to you today.  *If you need a refill on your cardiac medications before your next appointment, please call your pharmacy*   Lab Work:  Your physician recommends you have labs - BMP   If you have labs (blood work) drawn today and your tests are completely normal, you will receive your results only by: MyChart Message (if you have MyChart) OR A paper copy in the mail If you have any lab test that is abnormal or we need to change your treatment, we will call you to review the results.   Testing/Procedures:  Your physician has requested that you have an echocardiogram. Echocardiography is a painless test that uses sound waves to create images of your heart. It provides your doctor with information about the size and shape of your heart and how well your heart's chambers and valves are working. This procedure takes approximately one hour. There are no restrictions for this procedure. Please do NOT wear cologne, perfume, aftershave, or lotions (deodorant is allowed). Please arrive 15 minutes prior to your appointment time.   2. Your physician has requested that you have an abdominal aorta duplex. During this test, an ultrasound is used to evaluate the aorta. Allow 30 minutes for this exam. Do not eat after midnight the day before and avoid carbonated beverages.  Please note: We ask at that you not bring children with you during ultrasound (echo/ vascular) testing. Due to room size and safety concerns, children are not allowed in the ultrasound rooms during exams. Our front office staff cannot provide observation of children in our lobby area while testing is being conducted. An adult accompanying a patient to their appointment will only be allowed in the ultrasound room at the discretion of the ultrasound technician under special  circumstances. We apologize for any inconvenience.     Your cardiac CT will be scheduled at one of the below locations:   [facility] [address] [place], [place] [address] [phone]  OR   [facility] [address] [place], [place] [address] [phone]  If scheduled at [facility] or [facility], please arrive 15 mins early for check-in and test prep.  There is spacious parking and easy access to the radiology department from the [facility] Heart and Vascular entrance. Please enter here and check-in with the desk attendant.   If scheduled at [facility], please arrive 30 minutes early for check-in and test prep.  Please follow these instructions carefully (unless otherwise directed):  An IV will be required for this test and Nitroglycerin will be given.  Hold all erectile dysfunction medications at least 3 days (72 hrs) prior to test. (Ie viagra, cialis, sildenafil, tadalafil, etc)   On the Night Before the Test: Be sure to Drink plenty of water. Do not consume any caffeinated/decaffeinated beverages or chocolate 12 hours prior to your test. Do not take any antihistamines 12 hours prior to your test.  On the Day of the Test: Drink plenty of water until 1 hour prior to the test. Do not eat any food 1 hour prior to test. You may take your regular medications prior to the test.  Take metoprolol (Lopressor) two hours prior to test. Patients who wear a continuous glucose monitor MUST remove the device prior to scanning.  After the Test:  Drink plenty of water. After receiving IV contrast, you may experience a mild flushed feeling. This is normal. On occasion, you may experience a mild rash up to 24 hours after the test. This is not dangerous. If this occurs, you can take Benadryl 25 mg, Zyrtec, Claritin, or Allegra and increase your fluid intake.  (Patients taking Tikosyn should avoid Benadryl, and may take Zyrtec, Claritin, or Allegra) If you experience trouble breathing, this can be serious. If it is severe call 911 IMMEDIATELY. If it is mild, please call our office.  We will call to schedule your test 2-4 weeks out understanding that some insurance companies will need an authorization prior to the service being performed.   For more information and frequently asked questions, please visit our website : [email or web address]  For non-scheduling related questions, please contact the cardiac imaging nurse navigator should you have any questions/concerns: Cardiac Imaging Nurse Navigators Direct Office Dial: [phone]   For scheduling needs, including cancellations and rescheduling, please call [place], [phone].  Follow-Up: At [facility], you and your health needs are our priority.  As part of our continuing mission to provide you with exceptional heart care, we have created designated Provider Care Teams.  These Care Teams include your primary Cardiologist (physician) and Advanced Practice Providers (APPs -  Physician Assistants and Nurse Practitioners) who all work together to provide you with the care you need, when you need it.  We recommend signing up for the patient portal called "MyChart".  Sign up information is provided on this After Visit Summary.  MyChart is used to connect with patients for Virtual Visits (Telemedicine).  Patients are able to view lab/test results, encounter notes, upcoming appointments, etc.  Non-urgent messages can be sent to your provider as well.   To learn more about what you can do with MyChart, go to [email or web address].    Your next appointment:   2 - 3  month(s)  Provider:   You may see Debbe Odea, MD or one of the following Advanced Practice Providers on your designated Care Team:   Nicolasa Ducking, NP Eula Listen, PA-C Cadence Fransico Michael, PA-C Charlsie Quest,  NP Carlos Levering, NP

## 2023-11-08 LAB — BASIC METABOLIC PANEL
BUN/Creatinine Ratio: 10 (ref 9–20)
BUN: 13 mg/dL (ref 6–24)
CO2: 20 mmol/L (ref 20–29)
Calcium: 10 mg/dL (ref 8.7–10.2)
Chloride: 101 mmol/L (ref 96–106)
Creatinine, Ser: 1.27 mg/dL (ref 0.76–1.27)
Glucose: 112 mg/dL — ABNORMAL HIGH (ref 70–99)
Potassium: 4.9 mmol/L (ref 3.5–5.2)
Sodium: 137 mmol/L (ref 134–144)
eGFR: 65 mL/min/{1.73_m2} (ref >59)

## 2023-11-10 ENCOUNTER — Other Ambulatory Visit: Payer: 59

## 2023-11-15 ENCOUNTER — Other Ambulatory Visit: Payer: Self-pay | Admitting: Cardiology

## 2023-11-15 DIAGNOSIS — Z8249 Family history of ischemic heart disease and other diseases of the circulatory system: Secondary | ICD-10-CM

## 2023-11-15 DIAGNOSIS — F17200 Nicotine dependence, unspecified, uncomplicated: Secondary | ICD-10-CM

## 2023-11-15 DIAGNOSIS — R0609 Other forms of dyspnea: Secondary | ICD-10-CM

## 2023-11-15 DIAGNOSIS — R072 Precordial pain: Secondary | ICD-10-CM

## 2023-11-15 DIAGNOSIS — I10 Essential (primary) hypertension: Secondary | ICD-10-CM

## 2023-11-17 ENCOUNTER — Encounter: Payer: Self-pay | Admitting: Primary Care

## 2023-11-17 ENCOUNTER — Ambulatory Visit: Payer: 59 | Admitting: Primary Care

## 2023-11-17 VITALS — BP 138/82 | HR 94 | Temp 98.6°F | Ht 71.0 in | Wt 262.0 lb

## 2023-11-17 DIAGNOSIS — Z0001 Encounter for general adult medical examination with abnormal findings: Secondary | ICD-10-CM | POA: Diagnosis not present

## 2023-11-17 DIAGNOSIS — I10 Essential (primary) hypertension: Secondary | ICD-10-CM

## 2023-11-17 DIAGNOSIS — E1165 Type 2 diabetes mellitus with hyperglycemia: Secondary | ICD-10-CM

## 2023-11-17 DIAGNOSIS — N529 Male erectile dysfunction, unspecified: Secondary | ICD-10-CM

## 2023-11-17 DIAGNOSIS — F411 Generalized anxiety disorder: Secondary | ICD-10-CM

## 2023-11-17 DIAGNOSIS — L309 Dermatitis, unspecified: Secondary | ICD-10-CM

## 2023-11-17 DIAGNOSIS — H6121 Impacted cerumen, right ear: Secondary | ICD-10-CM

## 2023-11-17 DIAGNOSIS — Z1211 Encounter for screening for malignant neoplasm of colon: Secondary | ICD-10-CM

## 2023-11-17 DIAGNOSIS — Z125 Encounter for screening for malignant neoplasm of prostate: Secondary | ICD-10-CM | POA: Diagnosis not present

## 2023-11-17 DIAGNOSIS — R0609 Other forms of dyspnea: Secondary | ICD-10-CM

## 2023-11-17 DIAGNOSIS — Z0000 Encounter for general adult medical examination without abnormal findings: Secondary | ICD-10-CM

## 2023-11-17 DIAGNOSIS — Z720 Tobacco use: Secondary | ICD-10-CM

## 2023-11-17 DIAGNOSIS — Z8249 Family history of ischemic heart disease and other diseases of the circulatory system: Secondary | ICD-10-CM

## 2023-11-17 LAB — LIPID PANEL
Cholesterol: 177 mg/dL (ref 0–200)
HDL: 57.3 mg/dL (ref >39.00)
LDL Cholesterol: 99 mg/dL (ref 0–99)
NonHDL: 119.7
Total CHOL/HDL Ratio: 3
Triglycerides: 105 mg/dL (ref 0.0–149.0)
VLDL: 21 mg/dL (ref 0.0–40.0)

## 2023-11-17 LAB — HEPATIC FUNCTION PANEL
ALT: 26 U/L (ref 0–53)
AST: 20 U/L (ref 0–37)
Albumin: 4.4 g/dL (ref 3.5–5.2)
Alkaline Phosphatase: 58 U/L (ref 39–117)
Bilirubin, Direct: 0.1 mg/dL (ref 0.0–0.3)
Total Bilirubin: 0.6 mg/dL (ref 0.2–1.2)
Total Protein: 6.6 g/dL (ref 6.0–8.3)

## 2023-11-17 LAB — HEMOGLOBIN A1C: Hgb A1c MFr Bld: 6.4 % (ref 4.6–6.5)

## 2023-11-17 LAB — PSA: PSA: 1.1 ng/mL (ref 0.10–4.00)

## 2023-11-17 MED ORDER — TRIAMCINOLONE ACETONIDE 0.1 % EX CREA: 1.0000 | TOPICAL_CREAM | Freq: Two times a day (BID) | CUTANEOUS | 0 refills | Status: AC

## 2023-11-17 NOTE — Assessment & Plan Note (Signed)
Declines referral for lung cancer screening program. He will soon be completing calcium CT score which will capture lungs.

## 2023-11-17 NOTE — Assessment & Plan Note (Signed)
Right cerumen impaction identified on exam. Patient consented to irrigation of right canal.  Right canal irrigated. Patient tolerated well. TM's and canals post irrigation unremarkable except for dry skin.   Discussed home care instructions.

## 2023-11-17 NOTE — Assessment & Plan Note (Signed)
Following with cardiology.  CT calcium score pending, echocardiogram pending, ultrasound for AAA pending.

## 2023-11-17 NOTE — Assessment & Plan Note (Signed)
Declines all immunizations. Declines lung cancer screening. Colonoscopy overdue, referral placed to GI. PSA due and pending.  Discussed the importance of a healthy diet and regular exercise in order for weight loss, and to reduce the risk of further co-morbidity.  Exam stable. Labs pending.  Follow up in 1 year for repeat physical.

## 2023-11-17 NOTE — Progress Notes (Signed)
Subjective:    Patient ID: Austin Hill, male    DOB: [birth year], 58 y.o.   MRN: [id number]  HPI  Austin Hill is a very pleasant 58 y.o. male who presents today for complete physical and follow up of chronic conditions.  He would also like treatment for his chronic ear eczema. Chronic to the bilateral canals with itchy, dry skin. He's used several OTC products without improvement.   Immunizations: -Tetanus: Completed in 2016 -Influenza: Declines  -Shingles: Never completed, declines  -Pneumonia: Never completed, declines   Diet: Fair diet.  Exercise: No regular exercise. Active at work.  Eye exam: Completes annually  Dental exam: Completes semi-annually    Colonoscopy: Completed in January 2019, due July 2019 and never completed  Lung Cancer Screening: Never completed, referred in 2023 and 2024, did not complete  PSA: Due  BP Readings from Last 3 Encounters:  11/17/23 138/82  11/07/23 132/88  09/26/23 104/64         Review of Systems  Constitutional:  Negative for unexpected weight change.  HENT:  Negative for rhinorrhea.   Respiratory:  Positive for shortness of breath. Negative for cough.   Cardiovascular:  Negative for chest pain.  Gastrointestinal:  Negative for constipation and diarrhea.  Genitourinary:  Negative for difficulty urinating.  Musculoskeletal:  Negative for arthralgias and myalgias.  Skin:  Negative for rash.  Allergic/Immunologic: Negative for environmental allergies.  Neurological:  Negative for dizziness, numbness and headaches.  Psychiatric/Behavioral:  The patient is not nervous/anxious.          Past Medical History:  Diagnosis Date   Acute right-sided low back pain without sciatica 02/04/2020   Hypertension    Localized skin mass, lump, or swelling 02/15/2022    Social History   Socioeconomic History   Marital status: Divorced    Spouse name: Not on file   Number of children: Not on file   Years of  education: Not on file   Highest education level: Not on file  Occupational History   Not on file  Tobacco Use   Smoking status: Every Day    Current packs/day: 1.00    Average packs/day: 1 pack/day for 25.0 years (25.0 ttl pk-yrs)    Types: Cigarettes   Smokeless tobacco: Never  Vaping Use   Vaping status: Never Used  Substance and Sexual Activity   Alcohol use: Yes    Alcohol/week: 0.0 standard drinks of alcohol    Comment: every couple of days   Drug use: No   Sexual activity: Not on file  Other Topics Concern   Not on file  Social History Narrative   From [place] area.   Single, has girlfriend.   Three children. His son lives locally.   Enjoy camping, golfing, swimming.   Social Drivers of Corporate investment banker Strain: Not on file  Food Insecurity: Not on file  Transportation Needs: Not on file  Physical Activity: Not on file  Stress: Not on file  Social Connections: Not on file  Intimate Partner Violence: Not on file    Past Surgical History:  Procedure Laterality Date   COLONOSCOPY W/ BIOPSIES  about 10 years ago   polyps   COLONOSCOPY WITH PROPOFOL N/A 10/26/2017   Procedure: COLONOSCOPY WITH PROPOFOL;  Surgeon: Wyline Mood, MD;  Location: [facility] ENDOSCOPY;  Service: Gastroenterology;  Laterality: N/A;   WRIST SURGERY Left 68-5 years old   compound fracture    Family History  Problem Relation Age of  Onset   Aneurysm Father        Abdominal   Aneurysm Paternal Grandfather        Abdominal     No Known Allergies  Current Outpatient Medications on File Prior to Visit  Medication Sig Dispense Refill   albuterol (VENTOLIN HFA) 108 (90 Base) MCG/ACT inhaler Inhale 1-2 puffs into the lungs every 6 (six) hours as needed for wheezing or shortness of breath. 18 g 0   amLODipine (NORVASC) 5 MG tablet Take 1 tablet (5 mg total) by mouth daily. for blood pressure. 90 tablet 0   cyclobenzaprine (FLEXERIL) 5 MG tablet Take 1 tablet (5 mg total) by mouth at  bedtime as needed for muscle spasms. 14 tablet 0   losartan (COZAAR) 100 MG tablet Take 100 mg by mouth daily.     metoprolol tartrate (LOPRESSOR) 100 MG tablet Take 1 tablet (100 mg total) by mouth once for 1 dose. TAKE TWO HOURS PRIOR TO CARDIAC CTA 1 tablet 0   sildenafil (REVATIO) 20 MG tablet Take 2-5 tablets by mouth 30 minutes prior to intercourse (Patient not taking: Reported on 11/17/2023) 50 tablet 0   No current facility-administered medications on file prior to visit.    BP 138/82   Pulse 94   Temp 98.6 F (37 C) (Temporal)   Ht 5\' 11"  (1.803 m)   Wt 262 lb (118.8 kg)   SpO2 98%   BMI 36.54 kg/m  Objective:   Physical Exam HENT:     Right Ear: There is impacted cerumen.     Left Ear: Tympanic membrane normal.     Ears:     Comments: Dry skin to bilateral canals Eyes:     Pupils: Pupils are equal, round, and reactive to light.  Cardiovascular:     Rate and Rhythm: Normal rate and regular rhythm.  Pulmonary:     Effort: Pulmonary effort is normal.     Breath sounds: Normal breath sounds.  Abdominal:     General: Bowel sounds are normal.     Palpations: Abdomen is soft.     Tenderness: There is no abdominal tenderness.  Musculoskeletal:        General: Normal range of motion.     Cervical back: Neck supple.  Skin:    General: Skin is warm and dry.  Neurological:     Mental Status: He is alert and oriented to person, place, and time.     Cranial Nerves: No cranial nerve deficit.     Deep Tendon Reflexes:     Reflex Scores:      Patellar reflexes are 2+ on the right side and 2+ on the left side. Psychiatric:        Mood and Affect: Mood normal.           Assessment & Plan:  Preventative health care Assessment & Plan: Declines all immunizations. Declines lung cancer screening. Colonoscopy overdue, referral placed to GI. PSA due and pending.  Discussed the importance of a healthy diet and regular exercise in order for weight loss, and to reduce the  risk of further co-morbidity.  Exam stable. Labs pending.  Follow up in 1 year for repeat physical.    Screening for colon cancer -     Ambulatory referral to Gastroenterology  Screening for prostate cancer -     PSA  Essential hypertension Assessment & Plan: Borderline high.  Following with cardiology, AAA [place], echocardiogram, and CT calcium score imaging pending.  Continue amlodipine 5  mg daily, losartan 100 mg daily. BMP reviewed from February 2025.  Orders: -     Lipid panel -     Hemoglobin A1c -     Hepatic function panel  Type 2 diabetes mellitus with hyperglycemia, without long-term current use of insulin (HCC) Assessment & Plan: No recent A1c on file.  A1c ordered and pending. Remain off treatment.  Urine microalbumin due and pending.  Follow-up in 6 months.  Orders: -     Lipid panel -     Hemoglobin A1c -     Hepatic function panel  Eczema, unspecified type -     Triamcinolone Acetonide; Apply 1 Application topically 2 (two) times daily.  Dispense: 15 g; Refill: 0  Impacted cerumen of right ear Assessment & Plan: Right cerumen impaction identified on exam. Patient consented to irrigation of right canal.  Right canal irrigated. Patient tolerated well. TM's and canals post irrigation unremarkable except for dry skin.   Discussed home care instructions.     Erectile dysfunction, unspecified erectile dysfunction type Assessment & Plan: Stable.  Continue sildenafil 20 mg as needed.   Exertional dyspnea Assessment & Plan: Following with cardiology.  CT calcium score pending, echocardiogram pending, ultrasound for AAA pending.   Family history of abdominal aortic aneurysm (AAA) Assessment & Plan: Vascular AAA pending.   GAD (generalized anxiety disorder) Assessment & Plan: Denies concerns today.  Continue to monitor.   Tobacco abuse Assessment & Plan: Declines referral for lung cancer screening program. He will soon be  completing calcium CT score which will capture lungs.         Doreene Nest, NP

## 2023-11-17 NOTE — Assessment & Plan Note (Signed)
Borderline high.  Following with cardiology, AAA [place], echocardiogram, and CT calcium score imaging pending.  Continue amlodipine 5 mg daily, losartan 100 mg daily. BMP reviewed from February 2025.

## 2023-11-17 NOTE — Patient Instructions (Addendum)
Stop by the lab prior to leaving today. I will notify you of your results once received.   You may apply the triamcinolone cream twice daily as needed to the ear canals for itching.  You will either be contacted via phone regarding your referral to GI for the colonoscopy, or you may receive a letter on your MyChart portal from our referral team with instructions for scheduling an appointment. Please let us know if you have not been contacted by anyone within two weeks.  It was a pleasure to see you today!

## 2023-11-17 NOTE — Assessment & Plan Note (Signed)
Vascular AAA pending.

## 2023-11-17 NOTE — Assessment & Plan Note (Signed)
Denies concerns today. Continue to monitor.

## 2023-11-17 NOTE — Assessment & Plan Note (Signed)
-  Stable. Continue sildenafil 20 mg as needed.

## 2023-11-17 NOTE — Assessment & Plan Note (Signed)
No recent A1c on file.  A1c ordered and pending. Remain off treatment.  Urine microalbumin due and pending.  Follow-up in 6 months.

## 2023-11-23 ENCOUNTER — Ambulatory Visit: Payer: 59

## 2023-11-29 ENCOUNTER — Encounter: Payer: Self-pay | Admitting: *Deleted

## 2023-12-15 ENCOUNTER — Other Ambulatory Visit: Payer: Self-pay

## 2023-12-15 DIAGNOSIS — I10 Essential (primary) hypertension: Secondary | ICD-10-CM

## 2023-12-16 MED ORDER — AMLODIPINE BESYLATE 5 MG PO TABS
5.0000 mg | ORAL_TABLET | Freq: Every day | ORAL | 2 refills | Status: AC
Start: 2023-12-16 — End: 2024-12-15

## 2023-12-19 ENCOUNTER — Encounter (HOSPITAL_COMMUNITY): Payer: Self-pay

## 2023-12-20 ENCOUNTER — Ambulatory Visit (INDEPENDENT_AMBULATORY_CARE_PROVIDER_SITE_OTHER): Payer: 59

## 2023-12-20 ENCOUNTER — Ambulatory Visit: Payer: 59 | Attending: Cardiology

## 2023-12-20 DIAGNOSIS — R0609 Other forms of dyspnea: Secondary | ICD-10-CM | POA: Diagnosis not present

## 2023-12-20 DIAGNOSIS — Z8249 Family history of ischemic heart disease and other diseases of the circulatory system: Secondary | ICD-10-CM | POA: Diagnosis not present

## 2023-12-20 LAB — ECHOCARDIOGRAM COMPLETE
AV Mean grad: 2 mmHg
AV Peak grad: 4.2 mmHg
Ao pk vel: 1.03 m/s
Area-P 1/2: 3.37 cm2
S' Lateral: 3.7 cm

## 2023-12-20 MED ORDER — PERFLUTREN LIPID MICROSPHERE
1.0000 mL | INTRAVENOUS | Status: AC | PRN
Start: 2023-12-20 — End: 2023-12-20
  Administered 2023-12-20: 2 mL via INTRAVENOUS

## 2023-12-21 ENCOUNTER — Ambulatory Visit
Admission: RE | Admit: 2023-12-21 | Discharge: 2023-12-21 | Disposition: A | Discharge status: Home / Self Care | Source: Ambulatory Visit | Attending: Cardiology | Admitting: Cardiology

## 2023-12-21 DIAGNOSIS — R072 Precordial pain: Secondary | ICD-10-CM

## 2023-12-21 MED ORDER — SODIUM CHLORIDE 0.9 % IV BOLUS: 150.0000 mL | Freq: Once | INTRAVENOUS | Status: DC

## 2023-12-21 MED ORDER — IOHEXOL 350 MG/ML SOLN
100.0000 mL | Freq: Once | INTRAVENOUS | Status: DC | PRN
Start: 2023-12-21 — End: 2023-12-22

## 2023-12-21 NOTE — Progress Notes (Addendum)
 Arrived for Cardiac CT. Was prescribed premedication Metoprolol 100 mg however did not take it because he said the gave to him if he was nervous. States he is not nervous and did not take medication. Heart rate in 90's notified Dr Azucena Cecil reschedule patient with Metoprolol 100 mg and add Ivabradine 15 mg. Patient verbalized understanding of plan of care and rescheduled for 12/28/23 at 12:30pm at [facility].

## 2023-12-22 ENCOUNTER — Encounter: Payer: Self-pay | Admitting: *Deleted

## 2023-12-27 ENCOUNTER — Other Ambulatory Visit (HOSPITAL_COMMUNITY): Payer: Self-pay | Admitting: *Deleted

## 2023-12-27 ENCOUNTER — Telehealth (HOSPITAL_COMMUNITY): Payer: Self-pay | Admitting: *Deleted

## 2023-12-27 ENCOUNTER — Other Ambulatory Visit: Payer: Self-pay

## 2023-12-27 DIAGNOSIS — I10 Essential (primary) hypertension: Secondary | ICD-10-CM

## 2023-12-27 MED ORDER — IVABRADINE HCL 7.5 MG PO TABS: ORAL_TABLET | ORAL | 0 refills | Status: DC

## 2023-12-27 MED ORDER — LOSARTAN POTASSIUM 100 MG PO TABS: 100.0000 mg | ORAL_TABLET | Freq: Every day | ORAL | 2 refills | Status: DC

## 2023-12-27 NOTE — Telephone Encounter (Signed)
Reaching out to patient to offer assistance regarding upcoming cardiac imaging study; pt verbalizes understanding of appt date/time, parking situation and where to check in, pre-test NPO status and medications ordered, and verified current allergies; name and call back number provided for further questions should they arise  Jacquetta Polhamus RN Navigator Cardiac Imaging [facility] [phone] office [phone] cell  Patient to take 100mg metoprolol tartrate and 15mg ivabradine two hours prior to his cardiac CT scan. 

## 2023-12-28 ENCOUNTER — Ambulatory Visit
Admission: RE | Admit: 2023-12-28 | Discharge: 2023-12-28 | Disposition: A | Discharge status: Home / Self Care | Source: Ambulatory Visit | Attending: Cardiology | Admitting: Cardiology

## 2023-12-28 DIAGNOSIS — R072 Precordial pain: Secondary | ICD-10-CM | POA: Insufficient documentation

## 2023-12-28 MED ORDER — IOHEXOL 350 MG/ML SOLN
100.0000 mL | Freq: Once | INTRAVENOUS | Status: AC | PRN
  Administered 2023-12-28: 100 mL via INTRAVENOUS

## 2023-12-28 MED ORDER — NITROGLYCERIN 0.4 MG SL SUBL
0.8000 mg | SUBLINGUAL_TABLET | Freq: Once | SUBLINGUAL | Status: AC
  Administered 2023-12-28: 0.8 mg via SUBLINGUAL

## 2023-12-28 MED ORDER — DILTIAZEM HCL 25 MG/5ML IV SOLN: 10.0000 mg | INTRAVENOUS | Status: DC | PRN

## 2023-12-28 MED ORDER — SODIUM CHLORIDE 0.9 % IV BOLUS
150.0000 mL | Freq: Once | INTRAVENOUS | Status: AC
Start: 2023-12-28 — End: 2023-12-28
  Administered 2023-12-28: 150 mL via INTRAVENOUS

## 2023-12-28 MED ORDER — METOPROLOL TARTRATE 5 MG/5ML IV SOLN: 10.0000 mg | Freq: Once | INTRAVENOUS | Status: DC | PRN

## 2023-12-28 NOTE — Progress Notes (Signed)
 Patient tolerated procedure well. Ambulate w/o difficulty. Denies light headedness or being dizzy. Sitting up drinking water provided. Encouraged to drink extra water today and reasoning explained. Verbalized understanding. All questions answered. ABC intact. No further needs. Discharge from procedure area w/o issues.

## 2024-01-05 ENCOUNTER — Ambulatory Visit: Payer: 59 | Attending: Cardiology | Admitting: Cardiology

## 2024-01-05 ENCOUNTER — Encounter: Payer: Self-pay | Admitting: Cardiology

## 2024-01-05 VITALS — BP 124/80 | HR 82 | Resp 17 | Ht 71.0 in | Wt 259.0 lb

## 2024-01-05 DIAGNOSIS — R0609 Other forms of dyspnea: Secondary | ICD-10-CM | POA: Diagnosis not present

## 2024-01-05 DIAGNOSIS — Z8249 Family history of ischemic heart disease and other diseases of the circulatory system: Secondary | ICD-10-CM | POA: Diagnosis not present

## 2024-01-05 DIAGNOSIS — I10 Essential (primary) hypertension: Secondary | ICD-10-CM

## 2024-01-05 NOTE — Patient Instructions (Signed)
 Medication Instructions:  No changes at this time.   *If you need a refill on your cardiac medications before your next appointment, please call your pharmacy*  Lab Work: None  If you have labs (blood work) drawn today and your tests are completely normal, you will receive your results only by: MyChart Message (if you have MyChart) OR A paper copy in the mail If you have any lab test that is abnormal or we need to change your treatment, we will call you to review the results.  Testing/Procedures: None  Follow-Up: At [facility], you and your health needs are our priority.  As part of our continuing mission to provide you with exceptional heart care, our providers are all part of one team.  This team includes your primary Cardiologist (physician) and Advanced Practice Providers or APPs (Physician Assistants and Nurse Practitioners) who all work together to provide you with the care you need, when you need it.  Your next appointment:   As needed

## 2024-01-05 NOTE — Progress Notes (Signed)
 Cardiology Office Note:    Date:  01/05/2024   ID:  Austin Hill, DOB 1966-08-27, MRN [id number]  PCP:  Doreene Nest, NP   [facility] Providers Cardiologist:  Debbe Odea, MD     Referring MD: Doreene Nest, NP   Chief Complaint  Patient presents with   Dyspnea on exertion   Follow-up    2-3 month   Hypertension      History of Present Illness:    Austin Hill is a 58 y.o. male with a hx of hypertension, current smoker x 30+ years who presents for follow-up.  He was previously seen due to dyspnea on exertion.  Echocardiogram and coronary CT was obtained to evaluate cardiac etiology.  He denies any new symptoms.  Still has shortness of breath.  He has started exercising and being more active with improvement in his symptoms overall.  He denies chest pain.  He still smokes.  Presents for cardiac testing results.    Past Medical History:  Diagnosis Date   Acute right-sided low back pain without sciatica 02/04/2020   Hypertension    Localized skin mass, lump, or swelling 02/15/2022    Past Surgical History:  Procedure Laterality Date   COLONOSCOPY W/ BIOPSIES  about 10 years ago   polyps   COLONOSCOPY WITH PROPOFOL N/A 10/26/2017   Procedure: COLONOSCOPY WITH PROPOFOL;  Surgeon: Wyline Mood, MD;  Location: [facility] ENDOSCOPY;  Service: Gastroenterology;  Laterality: N/A;   WRIST SURGERY Left 91-80 years old   compound fracture    Current Medications: Current Meds  Medication Sig   albuterol (VENTOLIN HFA) 108 (90 Base) MCG/ACT inhaler Inhale 1-2 puffs into the lungs every 6 (six) hours as needed for wheezing or shortness of breath.   amLODipine (NORVASC) 5 MG tablet Take 1 tablet (5 mg total) by mouth daily. for blood pressure.   cyclobenzaprine (FLEXERIL) 5 MG tablet Take 1 tablet (5 mg total) by mouth at bedtime as needed for muscle spasms.   losartan-hydrochlorothiazide (HYZAAR) 100-25 MG tablet Take 1 tablet by mouth daily.    sildenafil (REVATIO) 20 MG tablet Take 2-5 tablets by mouth 30 minutes prior to intercourse   STIOLTO RESPIMAT 2.5-2.5 MCG/ACT AERS Take 1 puff by mouth daily.   triamcinolone cream (KENALOG) 0.1 % Apply 1 Application topically 2 (two) times daily.     Allergies:   Patient has no known allergies.   Social History   Socioeconomic History   Marital status: Single    Spouse name: Not on file   Number of children: 3   Years of education: Not on file   Highest education level: Not on file  Occupational History   Not on file  Tobacco Use   Smoking status: Every Day    Current packs/day: 1.00    Average packs/day: 1 pack/day for 44.3 years (44.3 ttl pk-yrs)    Types: Cigarettes    Start date: 72   Smokeless tobacco: Never  Vaping Use   Vaping status: Never Used  Substance and Sexual Activity   Alcohol use: Yes    Alcohol/week: 0.0 standard drinks of alcohol    Comment: every couple of days   Drug use: No   Sexual activity: Not on file  Other Topics Concern   Not on file  Social History Narrative   From [place] area.   Single, has girlfriend.   Three children. His son lives locally.   Enjoy camping, golfing, swimming.   Social Drivers of Health  Financial Resource Strain: Not on file  Food Insecurity: Not on file  Transportation Needs: Not on file  Physical Activity: Not on file  Stress: Not on file  Social Connections: Not on file     Family History: The patient's family history includes Aneurysm in his father and paternal grandfather.  ROS:   Please see the history of present illness.     All other systems reviewed and are negative.  EKGs/Labs/Other Studies Reviewed:    The following studies were reviewed today:  EKG Interpretation Date/Time:  Friday January 05 2024 08:59:06 EDT Ventricular Rate:  78 PR Interval:  142 QRS Duration:  86 QT Interval:  376 QTC Calculation: 428 R Axis:   50  Text Interpretation: Normal sinus rhythm Normal ECG Confirmed  by Debbe Odea ([id number]) on 01/05/2024 9:03:15 AM    Recent Labs: 11/07/2023: BUN 13; Creatinine, Ser 1.27; Potassium 4.9; Sodium 137 11/17/2023: ALT 26  Recent Lipid Panel    Component Value Date/Time   CHOL 177 11/17/2023 0859   TRIG 105.0 11/17/2023 0859   HDL 57.30 11/17/2023 0859   CHOLHDL 3 11/17/2023 0859   VLDL 21.0 11/17/2023 0859   LDLCALC 99 11/17/2023 0859     Risk Assessment/Calculations:             Physical Exam:    VS:  BP 124/80 (BP Location: Left Arm, Patient Position: Sitting, Cuff Size: Large)   Pulse 82   Resp 17   Ht 5\' 11"  (1.803 m)   Wt 259 lb (117.5 kg)   SpO2 98%   BMI 36.12 kg/m     Wt Readings from Last 3 Encounters:  01/05/24 259 lb (117.5 kg)  11/17/23 262 lb (118.8 kg)  11/07/23 260 lb 12.8 oz (118.3 kg)     GEN:  Well nourished, well developed in no acute distress HEENT: Normal NECK: No JVD; No carotid bruits CARDIAC: RRR, no murmurs, rubs, gallops RESPIRATORY:  Clear to auscultation without rales, wheezing or rhonchi  ABDOMEN: Soft, non-tender, non-distended MUSCULOSKELETAL:  No edema; No deformity  SKIN: Warm and dry NEUROLOGIC:  Alert and oriented x 3 PSYCHIATRIC:  Normal affect   ASSESSMENT:    1. Dyspnea on exertion   2. Essential hypertension   3. Family history of abdominal aortic aneurysm (AAA)    PLAN:    In order of problems listed above:  Dyspnea on exertion, coronary CT 3/25 with minimal proximal LAD stenosis less than 25%.  Echo 3/25 EF 60 to 65%.  Etiology of dyspnea likely deconditioning versus pulmonary due to smoking.  Symptoms overall improving with him starting an exercise regimen.  Smoking cessation advised.  If symptoms persist, follow-up with PCP, consider pulmonary workup. Hypertension, BP controlled.  Continue losartan-HCTZ 100-25 mg daily, Norvasc 5 mg daily.. Family history of abdominal aortic aneurysm, patient is a smoker.  Abdominal ultrasound showed no evidence of abdominal aortic aneurysm.   Smoking cessation strongly advised.  Follow-up as needed.     Medication Adjustments/Labs and Tests Ordered: Current medicines are reviewed at length with the patient today.  Concerns regarding medicines are outlined above.  Orders Placed This Encounter  Procedures   EKG 12-Lead   No orders of the defined types were placed in this encounter.   Patient Instructions  Medication Instructions:  No changes at this time.   *If you need a refill on your cardiac medications before your next appointment, please call your pharmacy*  Lab Work: None  If you have labs (blood work) drawn  today and your tests are completely normal, you will receive your results only by: MyChart Message (if you have MyChart) OR A paper copy in the mail If you have any lab test that is abnormal or we need to change your treatment, we will call you to review the results.  Testing/Procedures: None  Follow-Up: At [facility], you and your health needs are our priority.  As part of our continuing mission to provide you with exceptional heart care, our providers are all part of one team.  This team includes your primary Cardiologist (physician) and Advanced Practice Providers or APPs (Physician Assistants and Nurse Practitioners) who all work together to provide you with the care you need, when you need it.  Your next appointment:   As needed   Signed, Debbe Odea, MD  01/05/2024 9:21 AM    [facility]

## 2024-05-04 DIAGNOSIS — J42 Unspecified chronic bronchitis: Secondary | ICD-10-CM

## 2024-05-05 MED ORDER — ALBUTEROL SULFATE HFA 108 (90 BASE) MCG/ACT IN AERS: 1.0000 | INHALATION_SPRAY | Freq: Four times a day (QID) | RESPIRATORY_TRACT | 0 refills | Status: DC | PRN

## 2024-05-05 MED ORDER — STIOLTO RESPIMAT 2.5-2.5 MCG/ACT IN AERS
1.0000 | INHALATION_SPRAY | Freq: Every day | RESPIRATORY_TRACT | 1 refills | Status: AC
Start: 2024-05-05 — End: ?

## 2024-10-01 ENCOUNTER — Other Ambulatory Visit: Payer: Self-pay

## 2024-10-01 DIAGNOSIS — J42 Unspecified chronic bronchitis: Secondary | ICD-10-CM

## 2024-10-02 MED ORDER — ALBUTEROL SULFATE HFA 108 (90 BASE) MCG/ACT IN AERS: 1.0000 | INHALATION_SPRAY | Freq: Four times a day (QID) | RESPIRATORY_TRACT | 0 refills | Status: AC | PRN

## 2024-10-02 NOTE — Telephone Encounter (Signed)
Patient is due for CPE/follow up in late February, this will be required prior to any further refills.  Please schedule, thank you!

## 2024-11-19 ENCOUNTER — Encounter: Admitting: Primary Care
